# Patient Record
Sex: Female | Born: 1975 | Race: White | Hispanic: No | Marital: Married | Smoking: Former smoker
Health system: Southern US, Community
[De-identification: ages and names within clinical notes are randomized; demographics above are authoritative.]

## PROBLEM LIST (undated history)

## (undated) DIAGNOSIS — M1990 Unspecified osteoarthritis, unspecified site: Secondary | ICD-10-CM

## (undated) DIAGNOSIS — Z8719 Personal history of other diseases of the digestive system: Secondary | ICD-10-CM

## (undated) DIAGNOSIS — F419 Anxiety disorder, unspecified: Secondary | ICD-10-CM

## (undated) DIAGNOSIS — K219 Gastro-esophageal reflux disease without esophagitis: Secondary | ICD-10-CM

## (undated) DIAGNOSIS — J45909 Unspecified asthma, uncomplicated: Secondary | ICD-10-CM

## (undated) DIAGNOSIS — Z9889 Other specified postprocedural states: Secondary | ICD-10-CM

## (undated) DIAGNOSIS — I10 Essential (primary) hypertension: Secondary | ICD-10-CM

## (undated) DIAGNOSIS — R112 Nausea with vomiting, unspecified: Secondary | ICD-10-CM

## (undated) HISTORY — PX: LAPAROSCOPIC GASTRIC BANDING: SHX1100

## (undated) HISTORY — PX: KNEE ARTHROSCOPY: SUR90

## (undated) HISTORY — PX: APPENDECTOMY: SHX54

## (undated) HISTORY — PX: DILATION AND CURETTAGE OF UTERUS: SHX78

---

## 1999-05-14 ENCOUNTER — Encounter: Payer: Self-pay | Admitting: Family Medicine

## 1999-05-14 ENCOUNTER — Encounter: Admission: RE | Admit: 1999-05-14 | Discharge: 1999-05-14 | Payer: Self-pay | Admitting: Family Medicine

## 2016-07-10 HISTORY — PX: GASTRIC BANDING PORT REVISION: SHX5246

## 2016-07-14 ENCOUNTER — Other Ambulatory Visit (HOSPITAL_COMMUNITY): Payer: Self-pay | Admitting: General Surgery

## 2016-07-14 DIAGNOSIS — R110 Nausea: Secondary | ICD-10-CM

## 2016-07-17 ENCOUNTER — Ambulatory Visit (HOSPITAL_COMMUNITY)
Admission: RE | Admit: 2016-07-17 | Discharge: 2016-07-17 | Disposition: A | Discharge status: Home / Self Care | Payer: BC Managed Care – PPO | Source: Ambulatory Visit | Attending: General Surgery | Admitting: General Surgery

## 2016-07-17 ENCOUNTER — Encounter (HOSPITAL_COMMUNITY): Payer: Self-pay | Admitting: Radiology

## 2016-07-17 DIAGNOSIS — R110 Nausea: Secondary | ICD-10-CM | POA: Diagnosis present

## 2016-07-17 DIAGNOSIS — Z9884 Bariatric surgery status: Secondary | ICD-10-CM | POA: Insufficient documentation

## 2016-07-17 MED ORDER — MAGNESIUM HYDROXIDE 400 MG/5ML PO SUSP
ORAL | Status: AC
  Filled 2016-07-17: qty 30

## 2016-07-24 ENCOUNTER — Ambulatory Visit: Payer: Self-pay | Admitting: General Surgery

## 2016-07-24 NOTE — Progress Notes (Signed)
Spoke with angie at Dr. Madilyn FiremanHayes office request ing labs , lov and EKG tracing if available . Some show in Careeverywhere but won't print.

## 2016-07-24 NOTE — Patient Instructions (Signed)
Hulan SaasBobbi M Waiters  07/24/2016   Your procedure is scheduled on:   [facility]  [facility] 3rd floor to  Short Stay Center at    0715 AM.  Call this number if you have problems the morning of surgery [phone]   Remember: ONLY 1 PERSON MAY GO WITH YOU TO SHORT STAY TO GET  READY MORNING OF YOUR SURGERY.  Do not eat food or drink liquids :After Midnight.     Take these medicines the morning of surgery with A SIP OF WATER:  Zithromax, prozac, allegra and flonase if needed.                                You may not have any metal on your body including hair pins and              piercings  Do not wear jewelry, make-up, lotions, powders or perfumes, deodorant             Do not wear nail polish.  Do not shave  48 hours prior to surgery.               Do not bring valuables to the hospital. [facility] IS NOT             RESPONSIBLE   FOR VALUABLES.  Contacts, dentures or bridgework may not be worn into surgery.  Leave suitcase in the car. After surgery it may be brought to your room.                 Please read over the following fact sheets you were given: _____________________________________________________________________             [facility] - Preparing [facility] Before surgery, you can play an important role.  Because skin is not sterile, your skin needs to be as free of germs as possible.  You can reduce the number of germs on your skin by washing with CHG (chlorahexidine gluconate) soap before surgery.  CHG is an antiseptic cleaner which kills germs and bonds with the skin to continue killing germs even after washing. Please DO NOT use if you have an allergy to CHG or antibacterial soaps.  If your skin becomes reddened/irritated stop using the CHG and inform your nurse when you arrive at Short Stay. Do not shave (including legs and underarms) for at least 48 hours prior to the first CHG shower.  You may shave your  face/neck. Please follow these instructions carefully:  1.  Shower with CHG Soap the night before surgery and the  morning of Surgery.  2.  If you choose to wash your hair, wash your hair first as usual with your  normal  shampoo.  3.  After you shampoo, rinse your hair and body thoroughly to remove the  shampoo.                           4.  Use CHG as you would any other liquid soap.  You can apply chg directly  to the skin and wash                       Gently with a scrungie or clean washcloth.  5.  Apply the  CHG Soap to your body ONLY FROM THE NECK DOWN.   Do not use on face/ open                           Wound or open sores. Avoid contact with eyes, ears mouth and genitals (private parts).                       Wash face,  Genitals (private parts) with your normal soap.             6.  Wash thoroughly, paying special attention to the area where your surgery  will be performed.  7.  Thoroughly rinse your body with warm water from the neck down.  8.  DO NOT shower/wash with your normal soap after using and rinsing off  the CHG Soap.                9.  Pat yourself dry with a clean towel.            10.  Wear clean pajamas.            11.  Place clean sheets on your bed the night of your first shower and do not  sleep with pets. Day of Surgery : Do not apply any lotions/deodorants the morning of surgery.  Please wear clean clothes to the hospital/surgery center.  FAILURE TO FOLLOW THESE INSTRUCTIONS MAY RESULT IN THE CANCELLATION OF YOUR SURGERY PATIENT SIGNATURE_________________________________  NURSE SIGNATURE__________________________________  ________________________________________________________________________

## 2016-07-28 ENCOUNTER — Encounter (HOSPITAL_COMMUNITY): Payer: Self-pay | Admitting: Anesthesiology

## 2016-07-28 ENCOUNTER — Encounter (HOSPITAL_COMMUNITY): Payer: Self-pay

## 2016-07-28 ENCOUNTER — Encounter (HOSPITAL_COMMUNITY)
Admission: RE | Admit: 2016-07-28 | Discharge: 2016-07-28 | Disposition: A | Discharge status: Home / Self Care | Payer: BC Managed Care – PPO | Source: Ambulatory Visit | Attending: General Surgery | Admitting: General Surgery

## 2016-07-28 DIAGNOSIS — Z882 Allergy status to sulfonamides status: Secondary | ICD-10-CM | POA: Diagnosis not present

## 2016-07-28 DIAGNOSIS — R110 Nausea: Secondary | ICD-10-CM | POA: Diagnosis not present

## 2016-07-28 DIAGNOSIS — Z79899 Other long term (current) drug therapy: Secondary | ICD-10-CM | POA: Diagnosis not present

## 2016-07-28 DIAGNOSIS — Z87891 Personal history of nicotine dependence: Secondary | ICD-10-CM | POA: Diagnosis not present

## 2016-07-28 DIAGNOSIS — K649 Unspecified hemorrhoids: Secondary | ICD-10-CM | POA: Diagnosis not present

## 2016-07-28 DIAGNOSIS — K219 Gastro-esophageal reflux disease without esophagitis: Secondary | ICD-10-CM | POA: Diagnosis not present

## 2016-07-28 DIAGNOSIS — M1990 Unspecified osteoarthritis, unspecified site: Secondary | ICD-10-CM | POA: Diagnosis not present

## 2016-07-28 DIAGNOSIS — K9509 Other complications of gastric band procedure: Secondary | ICD-10-CM | POA: Diagnosis not present

## 2016-07-28 DIAGNOSIS — Z9884 Bariatric surgery status: Secondary | ICD-10-CM | POA: Diagnosis present

## 2016-07-28 DIAGNOSIS — R1012 Left upper quadrant pain: Secondary | ICD-10-CM | POA: Diagnosis not present

## 2016-07-28 DIAGNOSIS — Z8261 Family history of arthritis: Secondary | ICD-10-CM | POA: Diagnosis not present

## 2016-07-28 DIAGNOSIS — Y848 Other medical procedures as the cause of abnormal reaction of the patient, or of later complication, without mention of misadventure at the time of the procedure: Secondary | ICD-10-CM | POA: Diagnosis not present

## 2016-07-28 DIAGNOSIS — F419 Anxiety disorder, unspecified: Secondary | ICD-10-CM | POA: Diagnosis not present

## 2016-07-28 DIAGNOSIS — I10 Essential (primary) hypertension: Secondary | ICD-10-CM | POA: Diagnosis not present

## 2016-07-28 HISTORY — DX: Gastro-esophageal reflux disease without esophagitis: K21.9

## 2016-07-28 HISTORY — DX: Unspecified osteoarthritis, unspecified site: M19.90

## 2016-07-28 HISTORY — DX: Unspecified asthma, uncomplicated: J45.909

## 2016-07-28 HISTORY — DX: Personal history of other diseases of the digestive system: Z87.19

## 2016-07-28 HISTORY — DX: Anxiety disorder, unspecified: F41.9

## 2016-07-28 HISTORY — DX: Nausea with vomiting, unspecified: R11.2

## 2016-07-28 HISTORY — DX: Essential (primary) hypertension: I10

## 2016-07-28 HISTORY — DX: Other specified postprocedural states: Z98.890

## 2016-07-28 LAB — CBC WITH DIFFERENTIAL/PLATELET
Basophils Absolute: 0 10*3/uL (ref 0.0–0.1)
Basophils Relative: 0 %
EOS PCT: 2 %
Eosinophils Absolute: 0.1 10*3/uL (ref 0.0–0.7)
HCT: 38.5 % (ref 36.0–46.0)
Hemoglobin: 12.7 g/dL (ref 12.0–15.0)
LYMPHS ABS: 1.9 10*3/uL (ref 0.7–4.0)
LYMPHS PCT: 30 %
MCH: 28.9 pg (ref 26.0–34.0)
MCHC: 33 g/dL (ref 30.0–36.0)
MCV: 87.5 fL (ref 78.0–100.0)
MONO ABS: 0.6 10*3/uL (ref 0.1–1.0)
MONOS PCT: 9 %
Neutro Abs: 3.7 10*3/uL (ref 1.7–7.7)
Neutrophils Relative %: 59 %
Platelets: 296 10*3/uL (ref 150–400)
RBC: 4.4 MIL/uL (ref 3.87–5.11)
RDW: 13.6 % (ref 11.5–15.5)
WBC: 6.3 10*3/uL (ref 4.0–10.5)

## 2016-07-28 NOTE — Anesthesia Preprocedure Evaluation (Addendum)
Anesthesia Evaluation  Patient identified by MRN, date of birth, ID band Patient awake    Reviewed: Allergy & Precautions, NPO status , Patient's Chart, lab work & pertinent test results  History of Anesthesia Complications (+) PONV and history of anesthetic complications  Airway Mallampati: I  TM Distance: >3 FB Neck ROM: Full    Dental  (+) Teeth Intact, Dental Advisory Given   Pulmonary asthma , former smoker,    breath sounds clear to auscultation       Cardiovascular hypertension, Pt. on medications negative cardio ROS   Rhythm:Regular Rate:Normal     Neuro/Psych Anxiety negative neurological ROS  negative psych ROS   GI/Hepatic Neg liver ROS, hiatal hernia, GERD  ,  Endo/Other  negative endocrine ROS  Renal/GU negative Renal ROS  negative genitourinary   Musculoskeletal  (+) Arthritis , Osteoarthritis,    Abdominal   Peds negative pediatric ROS (+)  Hematology negative hematology ROS (+)   Anesthesia Other Findings Day of surgery medications reviewed with the patient.  Reproductive/Obstetrics negative OB ROS                            Anesthesia Physical Anesthesia Plan  ASA: III  Anesthesia Plan: General   Post-op Pain Management:    Induction: Intravenous  Airway Management Planned: Oral ETT  Additional Equipment:   Intra-op Plan:   Post-operative Plan: Extubation in OR  Informed Consent: I have reviewed the patients History and Physical, chart, labs and discussed the procedure including the risks, benefits and alternatives for the proposed anesthesia with the patient or authorized representative who has indicated his/her understanding and acceptance.   Dental advisory given  Plan Discussed with: CRNA  Anesthesia Plan Comments:         Anesthesia Quick Evaluation

## 2016-07-28 NOTE — Progress Notes (Signed)
CMP 07/17/16 [id number] 07/17/16 chart LOV Dr. Bing PlumeHaynes on chart

## 2016-07-29 ENCOUNTER — Ambulatory Visit (HOSPITAL_COMMUNITY): Payer: BC Managed Care – PPO | Admitting: Anesthesiology

## 2016-07-29 ENCOUNTER — Ambulatory Visit (HOSPITAL_COMMUNITY)
Admission: RE | Admit: 2016-07-29 | Discharge: 2016-07-29 | Disposition: A | Discharge status: Home / Self Care | Payer: BC Managed Care – PPO | Source: Ambulatory Visit | Attending: General Surgery | Admitting: General Surgery

## 2016-07-29 ENCOUNTER — Encounter (HOSPITAL_COMMUNITY)
Admission: RE | Disposition: A | Discharge status: Home / Self Care | Payer: Self-pay | Source: Ambulatory Visit | Attending: General Surgery

## 2016-07-29 ENCOUNTER — Encounter (HOSPITAL_COMMUNITY): Payer: Self-pay | Admitting: Anesthesiology

## 2016-07-29 DIAGNOSIS — Z79899 Other long term (current) drug therapy: Secondary | ICD-10-CM | POA: Insufficient documentation

## 2016-07-29 DIAGNOSIS — K219 Gastro-esophageal reflux disease without esophagitis: Secondary | ICD-10-CM | POA: Insufficient documentation

## 2016-07-29 DIAGNOSIS — R1012 Left upper quadrant pain: Secondary | ICD-10-CM | POA: Insufficient documentation

## 2016-07-29 DIAGNOSIS — I10 Essential (primary) hypertension: Secondary | ICD-10-CM | POA: Insufficient documentation

## 2016-07-29 DIAGNOSIS — F419 Anxiety disorder, unspecified: Secondary | ICD-10-CM | POA: Insufficient documentation

## 2016-07-29 DIAGNOSIS — K9509 Other complications of gastric band procedure: Secondary | ICD-10-CM | POA: Diagnosis not present

## 2016-07-29 DIAGNOSIS — K649 Unspecified hemorrhoids: Secondary | ICD-10-CM | POA: Insufficient documentation

## 2016-07-29 DIAGNOSIS — Y848 Other medical procedures as the cause of abnormal reaction of the patient, or of later complication, without mention of misadventure at the time of the procedure: Secondary | ICD-10-CM | POA: Insufficient documentation

## 2016-07-29 DIAGNOSIS — M1990 Unspecified osteoarthritis, unspecified site: Secondary | ICD-10-CM | POA: Insufficient documentation

## 2016-07-29 DIAGNOSIS — Z87891 Personal history of nicotine dependence: Secondary | ICD-10-CM | POA: Insufficient documentation

## 2016-07-29 DIAGNOSIS — Z8261 Family history of arthritis: Secondary | ICD-10-CM | POA: Insufficient documentation

## 2016-07-29 DIAGNOSIS — R110 Nausea: Secondary | ICD-10-CM | POA: Insufficient documentation

## 2016-07-29 DIAGNOSIS — Z882 Allergy status to sulfonamides status: Secondary | ICD-10-CM | POA: Insufficient documentation

## 2016-07-29 HISTORY — PX: LAPAROSCOPIC REPAIR AND REMOVAL OF GASTRIC BAND: SHX5919

## 2016-07-29 LAB — PREGNANCY, URINE: Preg Test, Ur: NEGATIVE

## 2016-07-29 SURGERY — LAPAROSCOPIC REPAIR AND REMOVAL OF GASTRIC BAND
Anesthesia: General | Site: Abdomen

## 2016-07-29 MED ORDER — LACTATED RINGERS IV SOLN: INTRAVENOUS | Status: DC

## 2016-07-29 MED ORDER — HEPARIN SODIUM (PORCINE) 5000 UNIT/ML IJ SOLN
5000.0000 [IU] | INTRAMUSCULAR | Status: AC
  Administered 2016-07-29: 5000 [IU] via SUBCUTANEOUS
  Filled 2016-07-29: qty 1

## 2016-07-29 MED ORDER — LACTATED RINGERS IR SOLN
Status: DC | PRN
  Administered 2016-07-29: 1000 mL

## 2016-07-29 MED ORDER — ACETAMINOPHEN 500 MG PO TABS
1000.0000 mg | ORAL_TABLET | ORAL | Status: AC
  Administered 2016-07-29: 1000 mg via ORAL
  Filled 2016-07-29: qty 2

## 2016-07-29 MED ORDER — SODIUM CHLORIDE 0.9% FLUSH: 3.0000 mL | INTRAVENOUS | Status: DC | PRN

## 2016-07-29 MED ORDER — BUPIVACAINE-EPINEPHRINE 0.25% -1:200000 IJ SOLN
INTRAMUSCULAR | Status: AC
  Filled 2016-07-29: qty 1

## 2016-07-29 MED ORDER — SODIUM CHLORIDE 0.9 % IJ SOLN
INTRAMUSCULAR | Status: AC
  Filled 2016-07-29: qty 50

## 2016-07-29 MED ORDER — ROCURONIUM BROMIDE 50 MG/5ML IV SOSY
PREFILLED_SYRINGE | INTRAVENOUS | Status: AC
Start: 2016-07-29 — End: 2016-07-29
  Filled 2016-07-29: qty 5

## 2016-07-29 MED ORDER — KETOROLAC TROMETHAMINE 30 MG/ML IJ SOLN
30.0000 mg | Freq: Four times a day (QID) | INTRAMUSCULAR | Status: DC
  Administered 2016-07-29: 30 mg via INTRAVENOUS

## 2016-07-29 MED ORDER — PROPOFOL 10 MG/ML IV BOLUS
INTRAVENOUS | Status: DC | PRN
  Administered 2016-07-29: 200 mg via INTRAVENOUS

## 2016-07-29 MED ORDER — PROPOFOL 10 MG/ML IV BOLUS
INTRAVENOUS | Status: AC
  Filled 2016-07-29: qty 40

## 2016-07-29 MED ORDER — CHLORHEXIDINE GLUCONATE CLOTH 2 % EX PADS: 6.0000 | MEDICATED_PAD | Freq: Once | CUTANEOUS | Status: DC

## 2016-07-29 MED ORDER — DEXAMETHASONE SODIUM PHOSPHATE 10 MG/ML IJ SOLN
INTRAMUSCULAR | Status: DC | PRN
  Administered 2016-07-29: 10 mg via INTRAVENOUS

## 2016-07-29 MED ORDER — CEFOTETAN DISODIUM-DEXTROSE 2-2.08 GM-% IV SOLR
2.0000 g | INTRAVENOUS | Status: AC
  Administered 2016-07-29: 2 g via INTRAVENOUS
  Filled 2016-07-29: qty 50

## 2016-07-29 MED ORDER — LIDOCAINE 2% (20 MG/ML) 5 ML SYRINGE
INTRAMUSCULAR | Status: DC | PRN
  Administered 2016-07-29: 80 mg via INTRAVENOUS

## 2016-07-29 MED ORDER — MIDAZOLAM HCL 5 MG/5ML IJ SOLN
INTRAMUSCULAR | Status: DC | PRN
  Administered 2016-07-29: 2 mg via INTRAVENOUS

## 2016-07-29 MED ORDER — ONDANSETRON HCL 4 MG/2ML IJ SOLN
INTRAMUSCULAR | Status: DC | PRN
  Administered 2016-07-29 (×2): 4 mg via INTRAVENOUS

## 2016-07-29 MED ORDER — SODIUM CHLORIDE 0.9 % IJ SOLN
INTRAMUSCULAR | Status: AC
  Filled 2016-07-29: qty 10

## 2016-07-29 MED ORDER — ACETAMINOPHEN 500 MG PO TABS: 1000.0000 mg | ORAL_TABLET | Freq: Four times a day (QID) | ORAL | Status: DC

## 2016-07-29 MED ORDER — SODIUM CHLORIDE 0.9 % IV SOLN
250.0000 mL | INTRAVENOUS | Status: DC | PRN
Start: 2016-07-29 — End: 2016-07-29

## 2016-07-29 MED ORDER — ACETAMINOPHEN 325 MG PO TABS: 650.0000 mg | ORAL_TABLET | ORAL | Status: DC | PRN

## 2016-07-29 MED ORDER — SODIUM CHLORIDE 0.9 % IV SOLN: INTRAVENOUS | Status: DC

## 2016-07-29 MED ORDER — SUFENTANIL CITRATE 50 MCG/ML IV SOLN
INTRAVENOUS | Status: DC | PRN
  Administered 2016-07-29: 5 ug via INTRAVENOUS
  Administered 2016-07-29: 10 ug via INTRAVENOUS
  Administered 2016-07-29 (×2): 5 ug via INTRAVENOUS

## 2016-07-29 MED ORDER — HYDROMORPHONE HCL 1 MG/ML IJ SOLN
0.2500 mg | INTRAMUSCULAR | Status: DC | PRN
  Administered 2016-07-29: 0.5 mg via INTRAVENOUS

## 2016-07-29 MED ORDER — LACTATED RINGERS IV SOLN
INTRAVENOUS | Status: DC | PRN
  Administered 2016-07-29 (×3): via INTRAVENOUS

## 2016-07-29 MED ORDER — SUFENTANIL CITRATE 50 MCG/ML IV SOLN
INTRAVENOUS | Status: AC
  Filled 2016-07-29: qty 1

## 2016-07-29 MED ORDER — BUPIVACAINE LIPOSOME 1.3 % IJ SUSP
20.0000 mL | Freq: Once | INTRAMUSCULAR | Status: AC
  Administered 2016-07-29: 20 mL
  Filled 2016-07-29: qty 20

## 2016-07-29 MED ORDER — GLYCOPYRROLATE 0.2 MG/ML IV SOSY
PREFILLED_SYRINGE | INTRAVENOUS | Status: DC | PRN
  Administered 2016-07-29: .2 mg via INTRAVENOUS

## 2016-07-29 MED ORDER — HYDROMORPHONE HCL 1 MG/ML IJ SOLN
INTRAMUSCULAR | Status: AC
  Filled 2016-07-29: qty 1

## 2016-07-29 MED ORDER — MIDAZOLAM HCL 2 MG/2ML IJ SOLN
INTRAMUSCULAR | Status: AC
  Filled 2016-07-29: qty 2

## 2016-07-29 MED ORDER — PROMETHAZINE HCL 25 MG/ML IJ SOLN: 6.2500 mg | INTRAMUSCULAR | Status: DC | PRN

## 2016-07-29 MED ORDER — EPHEDRINE 5 MG/ML INJ
INTRAVENOUS | Status: AC
  Filled 2016-07-29: qty 10

## 2016-07-29 MED ORDER — 0.9 % SODIUM CHLORIDE (POUR BTL) OPTIME
TOPICAL | Status: DC | PRN
  Administered 2016-07-29: 1000 mL

## 2016-07-29 MED ORDER — OXYCODONE HCL 5 MG PO TABS: 5.0000 mg | ORAL_TABLET | ORAL | Status: DC | PRN

## 2016-07-29 MED ORDER — MEPERIDINE HCL 50 MG/ML IJ SOLN: 6.2500 mg | INTRAMUSCULAR | Status: DC | PRN

## 2016-07-29 MED ORDER — ACETAMINOPHEN 650 MG RE SUPP
650.0000 mg | RECTAL | Status: DC | PRN
  Filled 2016-07-29: qty 1

## 2016-07-29 MED ORDER — LIDOCAINE 2% (20 MG/ML) 5 ML SYRINGE
INTRAMUSCULAR | Status: AC
  Filled 2016-07-29: qty 5

## 2016-07-29 MED ORDER — SCOPOLAMINE 1 MG/3DAYS TD PT72
1.0000 | MEDICATED_PATCH | TRANSDERMAL | Status: DC
  Administered 2016-07-29: 1.5 mg via TRANSDERMAL
  Filled 2016-07-29: qty 1

## 2016-07-29 MED ORDER — SODIUM CHLORIDE 0.9 % IJ SOLN
INTRAMUSCULAR | Status: DC | PRN
  Administered 2016-07-29: 50 mL

## 2016-07-29 MED ORDER — ONDANSETRON HCL 4 MG/2ML IJ SOLN
INTRAMUSCULAR | Status: AC
  Filled 2016-07-29: qty 2

## 2016-07-29 MED ORDER — ROCURONIUM BROMIDE 50 MG/5ML IV SOSY
PREFILLED_SYRINGE | INTRAVENOUS | Status: AC
  Filled 2016-07-29: qty 5

## 2016-07-29 MED ORDER — FENTANYL CITRATE (PF) 100 MCG/2ML IJ SOLN: 25.0000 ug | INTRAMUSCULAR | Status: DC | PRN

## 2016-07-29 MED ORDER — ROCURONIUM BROMIDE 10 MG/ML (PF) SYRINGE
PREFILLED_SYRINGE | INTRAVENOUS | Status: DC | PRN
  Administered 2016-07-29: 10 mg via INTRAVENOUS
  Administered 2016-07-29: 50 mg via INTRAVENOUS

## 2016-07-29 MED ORDER — SUGAMMADEX SODIUM 200 MG/2ML IV SOLN
INTRAVENOUS | Status: DC | PRN
  Administered 2016-07-29: 200 mg via INTRAVENOUS

## 2016-07-29 MED ORDER — DEXAMETHASONE SODIUM PHOSPHATE 4 MG/ML IJ SOLN
4.0000 mg | INTRAMUSCULAR | Status: DC
  Filled 2016-07-29: qty 1

## 2016-07-29 MED ORDER — EPHEDRINE SULFATE 50 MG/ML IJ SOLN
INTRAMUSCULAR | Status: DC | PRN
  Administered 2016-07-29: 10 mg via INTRAVENOUS

## 2016-07-29 MED ORDER — GABAPENTIN 300 MG PO CAPS
300.0000 mg | ORAL_CAPSULE | ORAL | Status: AC
  Administered 2016-07-29: 300 mg via ORAL
  Filled 2016-07-29: qty 1

## 2016-07-29 MED ORDER — SODIUM CHLORIDE 0.9% FLUSH: 3.0000 mL | Freq: Two times a day (BID) | INTRAVENOUS | Status: DC

## 2016-07-29 MED ORDER — DEXAMETHASONE SODIUM PHOSPHATE 10 MG/ML IJ SOLN
INTRAMUSCULAR | Status: AC
  Filled 2016-07-29: qty 1

## 2016-07-29 MED ORDER — OXYCODONE HCL 5 MG PO TABS: 5.0000 mg | ORAL_TABLET | ORAL | 0 refills | Status: DC | PRN

## 2016-07-29 SURGICAL SUPPLY — 49 items
BANDAGE ADH SHEER 1  50/CT (GAUZE/BANDAGES/DRESSINGS) ×2 IMPLANT
BENZOIN TINCTURE PRP APPL 2/3 (GAUZE/BANDAGES/DRESSINGS) ×2 IMPLANT
BLADE HEX COATED 2.75 (ELECTRODE) ×2 IMPLANT
BLADE SURG 15 STRL LF DISP TIS (BLADE) ×1 IMPLANT
BLADE SURG 15 STRL SS (BLADE) ×1
BLADE SURG SZ11 CARB STEEL (BLADE) ×2 IMPLANT
CHLORAPREP W/TINT 26ML (MISCELLANEOUS) ×2 IMPLANT
COVER SURGICAL LIGHT HANDLE (MISCELLANEOUS) ×2 IMPLANT
DECANTER SPIKE VIAL GLASS SM (MISCELLANEOUS) ×2 IMPLANT
DERMABOND ADVANCED (GAUZE/BANDAGES/DRESSINGS)
DERMABOND ADVANCED .7 DNX12 (GAUZE/BANDAGES/DRESSINGS) IMPLANT
DEVICE SUTURE ENDOST 10MM (ENDOMECHANICALS) IMPLANT
DISSECTOR BLUNT TIP ENDO 5MM (MISCELLANEOUS) IMPLANT
DRAPE UTILITY XL STRL (DRAPES) ×4 IMPLANT
ELECT PENCIL ROCKER SW 15FT (MISCELLANEOUS) ×2 IMPLANT
ELECT REM PT RETURN 9FT ADLT (ELECTROSURGICAL) ×2
ELECTRODE REM PT RTRN 9FT ADLT (ELECTROSURGICAL) ×1 IMPLANT
GLOVE BIO SURGEON STRL SZ7.5 (GLOVE) ×2 IMPLANT
GLOVE BIOGEL M STRL SZ7.5 (GLOVE) IMPLANT
GLOVE INDICATOR 8.0 STRL GRN (GLOVE) ×2 IMPLANT
GOWN STRL REUS W/TWL LRG LVL3 (GOWN DISPOSABLE) ×4 IMPLANT
GOWN STRL REUS W/TWL XL LVL3 (GOWN DISPOSABLE) ×4 IMPLANT
GRASPER SUT TROCAR 14GX15 (MISCELLANEOUS) ×2 IMPLANT
HOVERMATT SINGLE USE (MISCELLANEOUS) IMPLANT
IRRIG SUCT STRYKERFLOW 2 WTIP (MISCELLANEOUS)
IRRIGATION SUCT STRKRFLW 2 WTP (MISCELLANEOUS) IMPLANT
KIT BASIN OR (CUSTOM PROCEDURE TRAY) ×2 IMPLANT
L-HOOK LAP DISP 36CM (ELECTROSURGICAL) ×2
LHOOK LAP DISP 36CM (ELECTROSURGICAL) ×1 IMPLANT
NEEDLE SPNL 22GX3.5 QUINCKE BK (NEEDLE) ×2 IMPLANT
NS IRRIG 1000ML POUR BTL (IV SOLUTION) ×2 IMPLANT
PACK UNIVERSAL I (CUSTOM PROCEDURE TRAY) ×2 IMPLANT
SET IRRIG TUBING LAPAROSCOPIC (IRRIGATION / IRRIGATOR) ×2 IMPLANT
SHEARS HARMONIC ACE PLUS 36CM (ENDOMECHANICALS) IMPLANT
SOLUTION ANTI FOG 6CC (MISCELLANEOUS) ×2 IMPLANT
SPONGE LAP 18X18 X RAY DECT (DISPOSABLE) ×2 IMPLANT
STAPLER VISISTAT 35W (STAPLE) IMPLANT
STRIP CLOSURE SKIN 1/2X4 (GAUZE/BANDAGES/DRESSINGS) ×2 IMPLANT
SUT MNCRL AB 4-0 PS2 18 (SUTURE) ×2 IMPLANT
SUT SILK 0 (SUTURE) ×1
SUT SILK 0 30XBRD TIE 6 (SUTURE) ×1 IMPLANT
SUT VIC AB 0 BRD 54 (SUTURE) ×2 IMPLANT
SUT VIC AB 2-0 SH 27 (SUTURE) ×1
SUT VIC AB 2-0 SH 27X BRD (SUTURE) ×1 IMPLANT
SYR 20CC LL (SYRINGE) ×2 IMPLANT
SYR CONTROL 10ML LL (SYRINGE) ×2 IMPLANT
TOWEL OR 17X26 10 PK STRL BLUE (TOWEL DISPOSABLE) ×2 IMPLANT
TROCAR BLADELESS OPT 5 100 (ENDOMECHANICALS) ×6 IMPLANT
TUBING INSUF HEATED (TUBING) ×2 IMPLANT

## 2016-07-29 NOTE — Transfer of Care (Signed)
Immediate Anesthesia Transfer of Care Note  Patient: [name]  Procedure(s) Performed: Procedure(s): LAPAROSCOPIC REPAIR AND REMOVAL OF GASTRIC BAND (N/A)  Patient Location: PACU  Anesthesia Type:General  Level of Consciousness:  sedated, patient cooperative and responds to stimulation  Airway & Oxygen Therapy:Patient Spontanous Breathing and Patient connected to face mask oxgen  Post-op Assessment:  Report given to PACU RN and Post -op Vital signs reviewed and stable  Post vital signs:  Reviewed and stable  Last Vitals:  Vitals:   07/29/16 0740  BP: 119/86  Pulse: (!) 57  Resp: 18  Temp: 36.4 C    Complications: No apparent anesthesia complications

## 2016-07-29 NOTE — Op Note (Signed)
07/29/2016  11:10 AM  PATIENT:  [name]  41 y.o. female  PRE-OPERATIVE DIAGNOSIS:  H/O Lap Band, LUQ pain, nausea  POST-OPERATIVE DIAGNOSIS:  same  PROCEDURE:  Procedure(s): LAPAROSCOPIC REMOVAL OF GASTRIC BAND  SURGEON:  Surgeon(s): Gaynelle Adu, MD Ovidio Kin, MD  ASSISTANTS: Ovidio Kin, MD; Shelbie Proctor PA-S   ANESTHESIA:   general  DRAINS: none   LOCAL MEDICATIONS USED:  OTHER Exparel  SPECIMEN:  Source of Specimen:  lap band in pieces  DISPOSITION OF SPECIMEN:  discarded  COUNTS:  YES  INDICATION FOR PROCEDURE: 41 year old Caucasian female underwent laparoscopic adjustable gastric band placement in 2011 presented with ongoing left upper quadrant pain and nausea. She had the fluid removed from the band but still had intermittent left upper quadrant discomfort and nausea. She requested removal of her lap band. A preoperative upper GI showed no obstruction or slip or hiatal hernia. We had a prolonged conversation regarding risk and benefits of removal of the LapBand. We also talked about the possibility that removal of her lap band may not ameliorate her pain. Nonetheless she requested it be removed.  PROCEDURE: Patient received oral Tylenol and gabapentin preoperatively along with subcutaneous heparin. After obtaining informed consent she was taken to operating room 2 at [facility]. General endotracheal anesthesia was established. A Foley catheter and sequential compression devices were placed. Her abdomen was prepped and draped in the usual standard surgical fashion. A surgical timeout was performed. She received IV antibiotics prior to skin incision. Access was obtained to her abdomen using the Optiview technique. I used a small left upper quadrant old scar. Using a 0 5 mm laparoscope I advanced the trocar under direct visualization into the abdominal cavity. Pneumoperitoneum was smoothly established up to a patient pressure 15 mmHg. Laparoscope was advanced  and the abdominal cavity was surveilled. There is no evidence of injury surrounding structures. A 5 mm trocar was placed slightly above and to the left of the umbilicus. Another 5 mmin the right upper quadrant through an old scar. And a 15 mm trocar was placed through the old port site incision in the right mid abdomen. All of this is done under direct visualization. The [facility] liver retractor was placed in the epigastric location underneath the left lobe the liver through an old incision. There were a few adhesions underneath the left lobe the liver to the LapBand. But there wasn't too much adhesions around the lap band itself. It appeared that the imbricating sutures around the LapBand had fallen away. The LapBand was still buckled but there is no evidence of erosion. It was difficult to tell whether or not there is an actual slip however the LapBand was still oriented in the correct position; however, there were no imbricating sutures from the gastric fundus to the top of the pouch for example. A few adhesions were taken down around the proximal tubing with electrocautery. The LapBand was unbuckled. It was then cut. The portion of the LapBand around the stomach was removed from the 15 mm trocar. Upon closer visualization of her diaphragm there appear to be no gross evidence of a hiatal hernia. There is a small cicatrix around the proximal stomach where the LapBand had been placed. It was partially loosen but the entire cicatrix was not removed. A calibration tube was advanced through the oropharynx down into the stomach without any resistance it passed with ease. Because this to passed with ease with no difficulty I did not feel it was worth the  risk of removing the cicatrix. The [facility] liver retractor was removed. I then removed the 15 mm trocar in pneumoperitoneum was released. I opened up the 15 mm trocar incision slightly and using electrocautery dissected down toward the port. It had 3 sutures  anchoring it in place which were removed. The port and remaining tubing were then easily pulled out. I then reestablished pneumoperitoneum. The 15 mm fascial defect was closed with an interrupted 0 Vicryl using the PMI suture passer. Exparel was then infiltrated in bilateral upper abdominal walls under direct visualization as a tap block. Pneumoperitoneum was released. The 15 mm trocar site was closed in 2 layers with a 2-0 Vicryl in the deep dermis and a 4-0 Monocryl in a subcuticular fashion. Remaining skin incisions were closed with a 4-0 Monocryl. Benzoin, Steri-Strips, and sterile bandages were applied. All needle, instrument, and sponge counts were correct 2. There are no immediate complications. The patient tolerated procedure well  PLAN OF CARE: Discharge to home after PACU  PATIENT DISPOSITION:  PACU - hemodynamically stable.   Delay start of Pharmacological VTE agent (>24hrs) due to surgical blood loss or risk of bleeding:  not applicable  Mary SellaEric M. Andrey CampanileWilson, MD, FACS General, Bariatric, & Minimally Invasive Surgery [facility], [place]

## 2016-07-29 NOTE — Anesthesia Procedure Notes (Signed)
Procedure Name: Intubation Date/Time: 07/29/2016 9:13 AM Performed by: Milina Pagett, Virgel Gess Pre-anesthesia Checklist: Patient identified, Emergency Drugs available, Suction available, Patient being monitored and Timeout performed Patient Re-evaluated:Patient Re-evaluated prior to inductionOxygen Delivery Method: Circle system utilized Preoxygenation: Pre-oxygenation with 100% oxygen Intubation Type: IV induction Ventilation: Mask ventilation without difficulty Laryngoscope Size: Mac and 4 Grade View: Grade I Tube type: Oral Tube size: 7.5 mm Number of attempts: 1 Airway Equipment and Method: Stylet Placement Confirmation: ETT inserted through vocal cords under direct vision,  positive ETCO2,  CO2 detector and breath sounds checked- equal and bilateral Secured at: 21 cm Tube secured with: Tape Dental Injury: Teeth and Oropharynx as per pre-operative assessment

## 2016-07-29 NOTE — Anesthesia Postprocedure Evaluation (Addendum)
Anesthesia Post Note  Patient: [name]  Procedure(s) Performed: Procedure(s) (LRB): LAPAROSCOPIC REPAIR AND REMOVAL OF GASTRIC BAND (N/A)  Patient location during evaluation: PACU Anesthesia Type: General Level of consciousness: awake and alert Pain management: pain level controlled Vital Signs Assessment: post-procedure vital signs reviewed and stable Respiratory status: spontaneous breathing, nonlabored ventilation, respiratory function stable and patient connected to nasal cannula oxygen Cardiovascular status: blood pressure returned to baseline and stable Postop Assessment: no signs of nausea or vomiting Anesthetic complications: no       Last Vitals:  Vitals:   07/29/16 1151 07/29/16 1233  BP: 130/74 128/85  Pulse: 74 67  Resp: 16 16  Temp: 36.3 C 36.6 C    Last Pain:  Vitals:   07/29/16 1233  TempSrc: Oral  PainSc: 1                  Shelton SilvasKevin D Jackey Housey

## 2016-07-29 NOTE — H&P (Signed)
[name] 07/10/2016 9:58 AM Location: [facility] Patient #: [id number] DOB: Apr 20, 1976 Married / Language: English / Race: White Female   History of Present Illness Veronica Areola M. [name] MD; 07/10/2016 1:22 PM) The patient is a 41 year old female is here for LapBand followup. She is referred by Dr. [name] for evaluation of removal of laparoscopic adjustable gastric band. She underwent placement of a laparoscopic adjustable gastric band (AP standard) with hiatal hernia repair December 2011 at [facility] by Dr. [name]. She states that she has had intermittent issues with her band. Her preoperative weight was around 218 pounds. She states that she hasn't had much success with it. Most recently she was having obstructive symptoms and went to see them in early February and had all the fluid removed from her band. She has 4.8 cc removed. She was having a fair amount of nausea, bloating, as well as intolerance to solids and liquids. She is frustrated with her lap band experience and just wants the band removed. She is concerned about long-term implications for reflux and esophageal dilatation. Since she has had the fluid removed she no longer has dysphagia.. She is able to tolerate solids and liquids. She has less nausea but it is still there occasionally. She still has an occasional sensation of bloating. She is able to eat larger portions now. She denies any hematemesis, nighttime cough, reflux.  She denies any chest pain, chest pressure, shortness of breath, orthopnea, paroxysmal nocturnal dyspnea   Problem List/Past Medical Veronica Areola M. Andrey Campanile, MD; 07/10/2016 1:25 PM) NAUSEA (R11.0)   Past Surgical History Veronica Areola M. Andrey Campanile, MD; 07/10/2016 1:25 PM) Appendectomy  Knee Surgery  Left. Lap Band   Diagnostic Studies History Veronica Areola M. Andrey Campanile, MD; 07/10/2016 1:25 PM) Colonoscopy  never Mammogram  within last year Pap Smear  1-5 years ago  Allergies [name], CMA;  07/10/2016 9:59 AM) Sulfa Antibiotics  Rash Allergies Reconciled   Medication History [name], CMA; 07/10/2016 10:00 AM) Lisinopril (20MG  Tablet, Oral daily) Active. Azithromycin (250MG  Tablet, Oral every other day) Active. FLUoxetine HCl (20MG  Capsule, Oral daily) Active. Medications Reconciled  Social History Veronica Areola M. Andrey Campanile, MD; 07/10/2016 1:25 PM) Alcohol use  Moderate alcohol use. Caffeine use  Tea. No drug use  Tobacco use  Former smoker.  Family History Veronica Areola M. Andrey Campanile, MD; 07/10/2016 1:25 PM) Arthritis  Mother. Family history unknown  First Degree Relatives   Pregnancy / Birth History Veronica Sella. Andrey Campanile, MD; 07/10/2016 1:25 PM) Age at menarche  10 years. Contraceptive History  Oral contraceptives. Gravida  3 Length (months) of breastfeeding  7-12 Maternal age  52-20 Para  1 Regular periods   Other Problems Veronica Areola M. Andrey Campanile, MD; 07/10/2016 1:25 PM) Anxiety Disorder  Arthritis  LAP-BAND SURGERY STATUS (Z98.84)  AP standard band, with hiatal hernia repair, April 29, 2010, [facility], Dr. [name] Asthma  Back Pain  Gastroesophageal Reflux Disease  Hemorrhoids  High blood pressure   Vitals (Sade Bradford CMA; 07/10/2016 10:01 AM) 07/10/2016 10:00 AM Weight: 207 lb Height: 60in Body Surface Area: 1.89 m Body Mass Index: 40.43 kg/m  Temp.: 98.12F  Pulse: 61 (Regular)  Resp.: 98 (Unlabored)  BP: 112/72 (Sitting, Left Arm, Standard)       Physical Exam Veronica Areola M. Miesha Bachmann MD; 07/10/2016 1:19 PM) General Mental Status-Alert. General Appearance-Consistent with stated age. Hydration-Well hydrated. Voice-Normal.  Head and Neck Head-normocephalic, atraumatic with no lesions or palpable masses. Trachea-midline. Thyroid Gland Characteristics - normal size and consistency.  Eye Eyeball -  Bilateral-Normal. Sclera/Conjunctiva - Bilateral-No scleral icterus.  Chest and Lung Exam Chest and lung exam  reveals -quiet, even and easy respiratory effort with no use of accessory muscles and on auscultation, normal breath sounds, no adventitious sounds and normal vocal resonance. Inspection Chest Wall - Normal. Back - normal.  Breast - Did not examine.  Cardiovascular Cardiovascular examination reveals -normal heart sounds, regular rate and rhythm with no murmurs and normal pedal pulses bilaterally.  Abdomen Inspection Inspection of the abdomen reveals - No Hernias. Skin - Scar - Note: well healed trocar scars; palpable port right abd. Palpation/Percussion Palpation and Percussion of the abdomen reveal - Soft, Non Tender, No Rebound tenderness, No Rigidity (guarding) and No hepatosplenomegaly. Auscultation Auscultation of the abdomen reveals - Bowel sounds normal.  Peripheral Vascular Upper Extremity Palpation - Pulses bilaterally normal.  Neurologic Neurologic evaluation reveals -alert and oriented x 3 with no impairment of recent or remote memory. Mental Status-Normal.  Neuropsychiatric The patient's mood and affect are described as -normal. Judgment and Insight-insight is appropriate concerning matters relevant to self.  Musculoskeletal Normal Exam - Left-Upper Extremity Strength Normal and Lower Extremity Strength Normal. Normal Exam - Right-Upper Extremity Strength Normal and Lower Extremity Strength Normal.  Lymphatic Head & Neck  General Head & Neck Lymphatics: Bilateral - Description - Normal. Axillary - Did not examine. Femoral & Inguinal - Did not examine.    Assessment & Plan Veronica Areola(Daran Favaro M. Avrum Kimball MD; 07/10/2016 1:25 PM) LAP-BAND SURGERY STATUS [phone](Z98.84) Story: AP standard band, with hiatal hernia repair, [facility] Dr. Barnetta ChapelMcNatt Impression: Her symptoms of intolerance to solids and liquids have resolved thankfully with removal of her fluids. Nonetheless she still desires removal of her adjustable gastric band. She had an upper  GI in 2015 that had a question of a small hiatal hernia. I do not have access to that imaging. She inquires whether or not if she has a hiatal hernia if it could be repaired at the time of her band removal. We discussed at length the risk and benefits of surgery including but not limited to bleeding, infection, injury to surrounding structures, injury to the stomach, esophagus her liver, blood clot formation, hernia formation, cardiac and pulmonary events. We discussed the typical recovery course. I would like to get a repeat upper GI between now and surgery to evaluate her foregut anatomy. We did discuss potential hiatal hernia repair. I discussed how this could be somewhat challenging due to scarring from her prior surgery. I would only recommend attempts at repair if there is a truly significant recurrence. She voiced understanding. All of her questions were asked and answered. Current Plans You are being scheduled for surgery- Our schedulers will call you.  You should hear from our office's scheduling department within 5 working days about the location, date, and time of surgery. We try to make accommodations for patient's preferences in scheduling surgery, but sometimes the OR schedule or the surgeon's schedule prevents us from making those accommodations.  If you have not heard from our office [phone]) in 5 working days, call the office and ask for your surgeon's nurse.  If you have other questions about your diagnosis, plan, or surgery, call the office and ask for your surgeon's nurse.  Pt Education - [facility] Free Text Education/Instructions: discussed with patient and provided information. NAUSEA (R11.0)  Veronica SellaEric M. Andrey CampanileWilson, MD, FACS General, Bariatric, & Minimally Invasive Surgery [facility], [place]

## 2016-07-29 NOTE — Discharge Instructions (Signed)
General Anesthesia, Adult, Care After These instructions provide you with information about caring for yourself after your procedure. Your health care provider may also give you more specific instructions. Your treatment has been planned according to current medical practices, but problems sometimes occur. Call your health care provider if you have any problems or questions after your procedure. What can I expect after the procedure? After the procedure, it is common to have: Vomiting. A sore throat. Mental slowness. It is common to feel: Nauseous. Cold or shivery. Sleepy. Tired. Sore or achy, even in parts of your body where you did not have surgery. Follow these instructions at home: For at least 24 hours after the procedure:  Do not: Participate in activities where you could fall or become injured. Drive. Use heavy machinery. Drink alcohol. Take sleeping pills or medicines that cause drowsiness. Make important decisions or sign legal documents. Take care of children on your own. Rest. Eating and drinking  If you vomit, drink water, juice, or soup when you can drink without vomiting. Drink enough fluid to keep your urine clear or pale yellow. Make sure you have little or no nausea before eating solid foods. Follow the diet recommended by your health care provider. General instructions  Have a responsible adult stay with you until you are awake and alert. Return to your normal activities as told by your health care provider. Ask your health care provider what activities are safe for you. Take over-the-counter and prescription medicines only as told by your health care provider. If you smoke, do not smoke without supervision. Keep all follow-up visits as told by your health care provider. This is important. Contact a health care provider if: You continue to have nausea or vomiting at home, and medicines are not helpful. You cannot drink fluids or start eating again. You cannot  urinate after 8-12 hours. You develop a skin rash. You have fever. You have increasing redness at the site of your procedure. Get help right away if: You have difficulty breathing. You have chest pain. You have unexpected bleeding. You feel that you are having a life-threatening or urgent problem. This information is not intended to replace advice given to you by your health care provider. Make sure you discuss any questions you have with your health care provider. Document Released: 08/04/2000 Document Revised: 10/01/2015 Document Reviewed: 04/12/2015 Elsevier Interactive Patient Education  2017 [employer]. [facility]    LAPAROSCOPIC SURGERY: POST OP INSTRUCTIONS Always review your discharge instruction sheet given to you by the facility where your surgery was performed. IF YOU HAVE DISABILITY OR FAMILY LEAVE FORMS, YOU MUST BRING THEM TO THE OFFICE FOR PROCESSING.   DO NOT GIVE THEM TO YOUR DOCTOR.  1. A prescription for pain medication may be given to you upon discharge.  Take your pain medication as prescribed, if needed.  If narcotic pain medicine is not needed, then you may take acetaminophen (Tylenol) or ibuprofen (Advil) as needed. 2. Take your usually prescribed medications unless otherwise directed. 3. If you need a refill on your pain medication, please contact your pharmacy.  They will contact our office to request authorization. Prescriptions will not be filled after 5pm or on week-ends. 4. You should stay on a liquid (clear/full liquid) diet the first few days after arrival home.  Be sure to include lots of fluids daily. 5. Most patients will experience some swelling and bruising in the area of the incisions.  Ice packs will help.  Swelling and  bruising can take several days to resolve.  6. It is common to experience some constipation if taking pain medication after surgery.  Increasing fluid intake and taking a stool softener (such as Colace) will  usually help or prevent this problem from occurring.  A mild laxative (Milk of Magnesia or Miralax) should be taken according to package instructions if there are no bowel movements after 48 hours. 7. Unless discharge instructions indicate otherwise, you may remove your bandages 48 hours after surgery, and you may shower at that time.  You  have steri-strips (small skin tapes) in place directly over the incision.  These strips should be left on the skin for 7-10 days.  8. ACTIVITIES:  You may resume regular (light) daily activities beginning the next day--such as daily self-care, walking, climbing stairs--gradually increasing activities as tolerated.  You may have sexual intercourse when it is comfortable.  Refrain from any heavy lifting or straining until approved by your doctor. a. You may drive when you are no longer taking prescription pain medication, you can comfortably wear a seatbelt, and you can safely maneuver your car and apply brakes. 9. You should see your doctor in the office for a follow-up appointment approximately 2-3 weeks after your surgery.  Make sure that you call for this appointment within a day or two after you arrive home to insure a convenient appointment time. 10. OTHER INSTRUCTIONS: do not lift, push, or pull anything greater than 15 minutes  WHEN TO CALL YOUR DOCTOR: 1. Fever over 101.0 2. Inability to urinate 3. Continued bleeding from incision. 4. Increased pain, redness, or drainage from the incision. 5. Increasing abdominal pain  The clinic staff is available to answer your questions during regular business hours.  Please dont hesitate to call and ask to speak to one of the nurses for clinical concerns.  If you have a medical emergency, go to the nearest emergency room or call 911.  [facility] is always on call at the hospital. [address], [address], [place], [place]  [address] ? [address] [address], [place], [place]   [address] [phone]  [phone] ? [phone] ? FAX [phone] Web site: [email or web address]

## 2016-07-29 NOTE — Interval H&P Note (Signed)
History and Physical Interval Note:  07/29/2016 8:46 AM  Veronica [name]  has presented today for surgery, with the diagnosis of H/O Lap Band, N/V, HTN, GERD  The various methods of treatment have been discussed with the patient and family. After consideration of risks, benefits and other options for treatment, the patient has consented to  Procedure(s): LAPAROSCOPIC REPAIR AND REMOVAL OF GASTRIC BAND POSSIBLE HIATAL HERNIA REPAIR UPPER ENDOSCOPY (N/A) as a surgical intervention .  The patient's history has been reviewed, patient examined, no change in status, stable for surgery.  I have reviewed the patient's chart and labs.  Questions were answered to the patient's satisfaction.    Her UGI showed no obstruction; no hiatal hernia. As we discussed previously I could not 100% guarantee that removal of her lap band would ameliorate her LUQ pain/nausea/bloating sensation. We also rediscussed that I did not appreciate a hiatal hernia on UGI. We would test for one intraop and only proceed with repair if we found a clinically significant one but if found a small one I would not recommend attempts at repair bc I believe risks outweigh benefits. She voiced understanding and agreed with the plan  Veronica SellaEric Nunez. Veronica CampanileWilson, MD, FACS General, Bariatric, & Minimally Invasive Surgery [facility] [place]   [facility]IL[facility],[name]

## 2016-07-30 ENCOUNTER — Encounter (HOSPITAL_COMMUNITY): Payer: Self-pay | Admitting: General Surgery

## 2016-10-10 NOTE — Addendum Note (Signed)
Addendum  created 10/10/16 1112 by Yannet Rincon D, MD   Sign clinical note    

## 2017-06-10 ENCOUNTER — Ambulatory Visit: Payer: Self-pay | Admitting: General Surgery

## 2017-06-11 NOTE — Patient Instructions (Addendum)
[name]  06/11/2017   Your procedure is scheduled on: 06/15/2017   Report to [facility] Main  Entrance Report to Admitting at  07:25 AM   Call this number if you have problems the morning of surgery [phone]   Remember: No eating or drinking after Midnight   Take these medicines the morning of surgery with A SIP OF WATER: Prozac, and Protonix. You may also bring and use your nasal spray as needed.                                 You may not have any metal on your body including hair pins and              piercings  Do not wear jewelry, make-up, lotions, powders or perfumes, deodorant             Do not wear nail polish.  Do not shave  48 hours prior to surgery.            Do not bring valuables to the hospital. [facility] IS NOT             RESPONSIBLE   FOR VALUABLES.  Contacts, dentures or bridgework may not be worn into surgery.      Patients discharged the day of surgery will not be allowed to drive home.  Name and phone number of your driver: Gala Romney [phone]                Please read over the following fact sheets you were given: _____________________________________________________________________             NO SOLID FOOD AFTER MIDNIGHT THE NIGHT PRIOR TO SURGERY. NOTHING BY MOUTH EXCEPT CLEAR LIQUIDS UNTIL 3 HOURS PRIOR TO SCHEDULED SURGERY. PLEASE FINISH ENSURE DRINK PER SURGEON ORDER 3 HOURS PRIOR TO SCHEDULED SURGERY TIME WHICH NEEDS TO BE COMPLETED AT ___0625    CLEAR LIQUID DIET   Foods Allowed                                                                     Foods Excluded  Coffee and tea, regular and decaf                             liquids that you cannot  Plain Jell-O in any flavor                                             see through such as: Fruit ices (not with fruit pulp)                                     milk, soups, orange juice  Iced Popsicles  All solid food Carbonated  beverages, regular and diet                                    Cranberry, grape and apple juices Sports drinks like Gatorade Lightly seasoned clear broth or consume(fat free) Sugar, honey syrup  Sample Menu Breakfast                                Lunch                                     Supper Cranberry juice                    Beef broth                            Chicken broth Jell-O                                     Grape juice                           Apple juice Coffee or tea                        Jell-O                                      Popsicle                                                Coffee or tea                        Coffee or tea  _____________________________________________________________________      [facility] Pre[facility] Before surgery, you can play an important role.  Because skin is not sterile, your skin needs to be as free of germs as possible.  You can reduce the number of germs on your skin by washing with CHG (chlorahexidine gluconate) soap before surgery.  CHG is an antiseptic cleaner which kills germs and bonds with the skin to continue killing germs even after washing. Please DO NOT use if you have an allergy to CHG or antibacterial soaps.  If your skin becomes reddened/irritated stop using the CHG and inform your nurse when you arrive at Short Stay. Do not shave (including legs and underarms) for at least 48 hours prior to the first CHG shower.  You may shave your face/neck. Please follow these instructions carefully:  1.  Shower with CHG Soap the night before surgery and the  morning of Surgery.  2.  If you choose to wash your hair, wash your hair first as usual with your  normal  shampoo.  3.  After you shampoo, rinse your hair and body thoroughly to remove the  shampoo.  4.  Use CHG as you would any other liquid soap.  You can apply chg directly  to the skin and wash                       Gently with a  scrungie or clean washcloth.  5.  Apply the CHG Soap to your body ONLY FROM THE NECK DOWN.   Do not use on face/ open                           Wound or open sores. Avoid contact with eyes, ears mouth and genitals (private parts).                       Wash face,  Genitals (private parts) with your normal soap.             6.  Wash thoroughly, paying special attention to the area where your surgery  will be performed.  7.  Thoroughly rinse your body with warm water from the neck down.  8.  DO NOT shower/wash with your normal soap after using and rinsing off  the CHG Soap.                9.  Pat yourself dry with a clean towel.            10.  Wear clean pajamas.            11.  Place clean sheets on your bed the night of your first shower and do not  sleep with pets. Day of Surgery : Do not apply any lotions/deodorants the morning of surgery.  Please wear clean clothes to the hospital/surgery center.  FAILURE TO FOLLOW THESE INSTRUCTIONS MAY RESULT IN THE CANCELLATION OF YOUR SURGERY PATIENT SIGNATURE_________________________________  NURSE SIGNATURE__________________________________  ________________________________________________________________________

## 2017-06-12 ENCOUNTER — Encounter (HOSPITAL_COMMUNITY): Payer: Self-pay

## 2017-06-12 ENCOUNTER — Encounter (HOSPITAL_COMMUNITY)
Admission: RE | Admit: 2017-06-12 | Discharge: 2017-06-12 | Disposition: A | Discharge status: Home / Self Care | Payer: BC Managed Care – PPO | Source: Ambulatory Visit | Attending: General Surgery | Admitting: General Surgery

## 2017-06-12 ENCOUNTER — Other Ambulatory Visit: Payer: Self-pay

## 2017-06-12 DIAGNOSIS — K219 Gastro-esophageal reflux disease without esophagitis: Secondary | ICD-10-CM | POA: Diagnosis not present

## 2017-06-12 DIAGNOSIS — K8010 Calculus of gallbladder with chronic cholecystitis without obstruction: Secondary | ICD-10-CM | POA: Diagnosis present

## 2017-06-12 DIAGNOSIS — J45909 Unspecified asthma, uncomplicated: Secondary | ICD-10-CM | POA: Diagnosis not present

## 2017-06-12 DIAGNOSIS — K649 Unspecified hemorrhoids: Secondary | ICD-10-CM | POA: Diagnosis not present

## 2017-06-12 DIAGNOSIS — K828 Other specified diseases of gallbladder: Secondary | ICD-10-CM | POA: Diagnosis not present

## 2017-06-12 DIAGNOSIS — K811 Chronic cholecystitis: Secondary | ICD-10-CM | POA: Diagnosis not present

## 2017-06-12 DIAGNOSIS — Z79899 Other long term (current) drug therapy: Secondary | ICD-10-CM | POA: Diagnosis not present

## 2017-06-12 DIAGNOSIS — Z87891 Personal history of nicotine dependence: Secondary | ICD-10-CM | POA: Diagnosis not present

## 2017-06-12 DIAGNOSIS — M549 Dorsalgia, unspecified: Secondary | ICD-10-CM | POA: Diagnosis not present

## 2017-06-12 DIAGNOSIS — M1990 Unspecified osteoarthritis, unspecified site: Secondary | ICD-10-CM | POA: Diagnosis not present

## 2017-06-12 DIAGNOSIS — K449 Diaphragmatic hernia without obstruction or gangrene: Secondary | ICD-10-CM | POA: Diagnosis not present

## 2017-06-12 DIAGNOSIS — F419 Anxiety disorder, unspecified: Secondary | ICD-10-CM | POA: Diagnosis not present

## 2017-06-12 DIAGNOSIS — I10 Essential (primary) hypertension: Secondary | ICD-10-CM | POA: Diagnosis not present

## 2017-06-12 LAB — CBC WITH DIFFERENTIAL/PLATELET
BASOS ABS: 0 10*3/uL (ref 0.0–0.1)
BASOS PCT: 0 %
EOS ABS: 0.1 10*3/uL (ref 0.0–0.7)
EOS PCT: 1 %
HCT: 42.8 % (ref 36.0–46.0)
Hemoglobin: 14.3 g/dL (ref 12.0–15.0)
LYMPHS PCT: 27 %
Lymphs Abs: 1.9 10*3/uL (ref 0.7–4.0)
MCH: 29.6 pg (ref 26.0–34.0)
MCHC: 33.4 g/dL (ref 30.0–36.0)
MCV: 88.6 fL (ref 78.0–100.0)
MONO ABS: 0.6 10*3/uL (ref 0.1–1.0)
Monocytes Relative: 8 %
Neutro Abs: 4.6 10*3/uL (ref 1.7–7.7)
Neutrophils Relative %: 64 %
PLATELETS: 266 10*3/uL (ref 150–400)
RBC: 4.83 MIL/uL (ref 3.87–5.11)
RDW: 13.4 % (ref 11.5–15.5)
WBC: 7.1 10*3/uL (ref 4.0–10.5)

## 2017-06-12 LAB — COMPREHENSIVE METABOLIC PANEL
ALBUMIN: 4.3 g/dL (ref 3.5–5.0)
ALT: 17 U/L (ref 14–54)
AST: 17 U/L (ref 15–41)
Alkaline Phosphatase: 64 U/L (ref 38–126)
Anion gap: 7 (ref 5–15)
BUN: 11 mg/dL (ref 6–20)
CHLORIDE: 103 mmol/L (ref 101–111)
CO2: 27 mmol/L (ref 22–32)
Calcium: 9.5 mg/dL (ref 8.9–10.3)
Creatinine, Ser: 0.74 mg/dL (ref 0.44–1.00)
GFR calc Af Amer: >60 mL/min (ref >60)
Glucose, Bld: 97 mg/dL (ref 65–99)
POTASSIUM: 4.1 mmol/L (ref 3.5–5.1)
SODIUM: 137 mmol/L (ref 135–145)
Total Bilirubin: 0.4 mg/dL (ref 0.3–1.2)
Total Protein: 7.9 g/dL (ref 6.5–8.1)

## 2017-06-12 LAB — HCG, SERUM, QUALITATIVE: PREG SERUM: NEGATIVE

## 2017-06-12 NOTE — Progress Notes (Signed)
07-28-16 (Epic) EKG

## 2017-06-12 NOTE — Progress Notes (Addendum)
Pt has history of sinus brady as well as palpitations. Pt saw cardiology on 06-05-17. Cardiology requesting ECHO and Stress test. Per pt, tests are scheduled for 06-23-17, and 06-25-17, which is after the surgery date. Discussed the above with Dr. Twanna HyGermoth, Anesthesiologist. Dr. Twanna HyGermoth is requesting cardiac clearance.    Pt advised of Dr. Twanna HyGermoth decision. Also contacted Dr. Tawana ScaleWilson's office and spoke with Selena BattenKim inTriage to advise of the above. Cardiac clearance request also faxed to Dr. Gwen HerUsman Khawaja, cardiologist.  Pt also stated that she will be stop by cardiologist office in route to work, and have cardiac clearance faxed to us.    Cardiac clearance received, and discussed again with Dr. Twanna HyGermoth. Okay for pt to proceed with surgery.

## 2017-06-15 ENCOUNTER — Ambulatory Visit (HOSPITAL_COMMUNITY)
Admission: RE | Admit: 2017-06-15 | Discharge: 2017-06-15 | Disposition: A | Discharge status: Home / Self Care | Payer: BC Managed Care – PPO | Source: Ambulatory Visit | Attending: General Surgery | Admitting: General Surgery

## 2017-06-15 ENCOUNTER — Encounter (HOSPITAL_COMMUNITY)
Admission: RE | Disposition: A | Discharge status: Home / Self Care | Payer: Self-pay | Source: Ambulatory Visit | Attending: General Surgery

## 2017-06-15 ENCOUNTER — Ambulatory Visit (HOSPITAL_COMMUNITY): Payer: BC Managed Care – PPO | Admitting: Certified Registered"

## 2017-06-15 ENCOUNTER — Ambulatory Visit (HOSPITAL_COMMUNITY): Payer: BC Managed Care – PPO

## 2017-06-15 ENCOUNTER — Encounter (HOSPITAL_COMMUNITY): Payer: Self-pay | Admitting: *Deleted

## 2017-06-15 DIAGNOSIS — I10 Essential (primary) hypertension: Secondary | ICD-10-CM | POA: Insufficient documentation

## 2017-06-15 DIAGNOSIS — K449 Diaphragmatic hernia without obstruction or gangrene: Secondary | ICD-10-CM | POA: Insufficient documentation

## 2017-06-15 DIAGNOSIS — K828 Other specified diseases of gallbladder: Secondary | ICD-10-CM | POA: Insufficient documentation

## 2017-06-15 DIAGNOSIS — M1990 Unspecified osteoarthritis, unspecified site: Secondary | ICD-10-CM | POA: Insufficient documentation

## 2017-06-15 DIAGNOSIS — J45909 Unspecified asthma, uncomplicated: Secondary | ICD-10-CM | POA: Insufficient documentation

## 2017-06-15 DIAGNOSIS — K649 Unspecified hemorrhoids: Secondary | ICD-10-CM | POA: Insufficient documentation

## 2017-06-15 DIAGNOSIS — K811 Chronic cholecystitis: Secondary | ICD-10-CM | POA: Diagnosis not present

## 2017-06-15 DIAGNOSIS — K219 Gastro-esophageal reflux disease without esophagitis: Secondary | ICD-10-CM | POA: Insufficient documentation

## 2017-06-15 DIAGNOSIS — Z79899 Other long term (current) drug therapy: Secondary | ICD-10-CM | POA: Insufficient documentation

## 2017-06-15 DIAGNOSIS — F419 Anxiety disorder, unspecified: Secondary | ICD-10-CM | POA: Insufficient documentation

## 2017-06-15 DIAGNOSIS — Z419 Encounter for procedure for purposes other than remedying health state, unspecified: Secondary | ICD-10-CM

## 2017-06-15 DIAGNOSIS — M549 Dorsalgia, unspecified: Secondary | ICD-10-CM | POA: Insufficient documentation

## 2017-06-15 DIAGNOSIS — Z87891 Personal history of nicotine dependence: Secondary | ICD-10-CM | POA: Insufficient documentation

## 2017-06-15 HISTORY — PX: CHOLECYSTECTOMY: SHX55

## 2017-06-15 SURGERY — LAPAROSCOPIC CHOLECYSTECTOMY WITH INTRAOPERATIVE CHOLANGIOGRAM
Anesthesia: General

## 2017-06-15 MED ORDER — RINGERS IRRIGATION IR SOLN
Status: DC | PRN
  Administered 2017-06-15: 1000 mL

## 2017-06-15 MED ORDER — FENTANYL CITRATE (PF) 250 MCG/5ML IJ SOLN
INTRAMUSCULAR | Status: AC
  Filled 2017-06-15: qty 5

## 2017-06-15 MED ORDER — HYDROMORPHONE HCL 1 MG/ML IJ SOLN: 0.2500 mg | INTRAMUSCULAR | Status: DC | PRN

## 2017-06-15 MED ORDER — EPHEDRINE SULFATE-NACL 50-0.9 MG/10ML-% IV SOSY
PREFILLED_SYRINGE | INTRAVENOUS | Status: DC | PRN
  Administered 2017-06-15: 5 mg via INTRAVENOUS
  Administered 2017-06-15 (×2): 10 mg via INTRAVENOUS

## 2017-06-15 MED ORDER — SCOPOLAMINE 1 MG/3DAYS TD PT72
MEDICATED_PATCH | TRANSDERMAL | Status: AC
  Filled 2017-06-15: qty 1

## 2017-06-15 MED ORDER — SCOPOLAMINE 1 MG/3DAYS TD PT72
MEDICATED_PATCH | TRANSDERMAL | Status: DC | PRN
  Administered 2017-06-15: 1 via TRANSDERMAL

## 2017-06-15 MED ORDER — SODIUM CHLORIDE 0.9% FLUSH: 3.0000 mL | Freq: Two times a day (BID) | INTRAVENOUS | Status: DC

## 2017-06-15 MED ORDER — SODIUM CHLORIDE 0.9 % IV SOLN: 250.0000 mL | INTRAVENOUS | Status: DC | PRN

## 2017-06-15 MED ORDER — BUPIVACAINE-EPINEPHRINE 0.25% -1:200000 IJ SOLN
INTRAMUSCULAR | Status: DC | PRN
  Administered 2017-06-15: 35 mL

## 2017-06-15 MED ORDER — FENTANYL CITRATE (PF) 250 MCG/5ML IJ SOLN
INTRAMUSCULAR | Status: DC | PRN
  Administered 2017-06-15 (×3): 50 ug via INTRAVENOUS
  Administered 2017-06-15: 100 ug via INTRAVENOUS
  Administered 2017-06-15 (×2): 50 ug via INTRAVENOUS
  Administered 2017-06-15: 150 ug via INTRAVENOUS

## 2017-06-15 MED ORDER — PROPOFOL 10 MG/ML IV BOLUS
INTRAVENOUS | Status: AC
  Filled 2017-06-15: qty 20

## 2017-06-15 MED ORDER — ACETAMINOPHEN 500 MG PO TABS
1000.0000 mg | ORAL_TABLET | ORAL | Status: AC
  Administered 2017-06-15: 1000 mg via ORAL
  Filled 2017-06-15: qty 2

## 2017-06-15 MED ORDER — SUGAMMADEX SODIUM 500 MG/5ML IV SOLN
INTRAVENOUS | Status: AC
  Filled 2017-06-15: qty 5

## 2017-06-15 MED ORDER — ACETAMINOPHEN 650 MG RE SUPP
650.0000 mg | RECTAL | Status: DC | PRN
  Filled 2017-06-15: qty 1

## 2017-06-15 MED ORDER — ACETAMINOPHEN 325 MG PO TABS
650.0000 mg | ORAL_TABLET | ORAL | Status: DC | PRN
  Administered 2017-06-15: 650 mg via ORAL

## 2017-06-15 MED ORDER — OXYCODONE HCL 5 MG PO TABS: 5.0000 mg | ORAL_TABLET | ORAL | 0 refills | Status: DC | PRN

## 2017-06-15 MED ORDER — GABAPENTIN 300 MG PO CAPS
300.0000 mg | ORAL_CAPSULE | ORAL | Status: AC
  Administered 2017-06-15: 300 mg via ORAL
  Filled 2017-06-15: qty 1

## 2017-06-15 MED ORDER — LACTATED RINGERS IV SOLN: INTRAVENOUS | Status: DC

## 2017-06-15 MED ORDER — SUCCINYLCHOLINE CHLORIDE 200 MG/10ML IV SOSY
PREFILLED_SYRINGE | INTRAVENOUS | Status: AC
  Filled 2017-06-15: qty 10

## 2017-06-15 MED ORDER — OXYCODONE HCL 5 MG PO TABS: 5.0000 mg | ORAL_TABLET | ORAL | Status: DC | PRN

## 2017-06-15 MED ORDER — LIDOCAINE 2% (20 MG/ML) 5 ML SYRINGE
INTRAMUSCULAR | Status: DC | PRN
  Administered 2017-06-15: 100 mg via INTRAVENOUS

## 2017-06-15 MED ORDER — FENTANYL CITRATE (PF) 100 MCG/2ML IJ SOLN: 25.0000 ug | INTRAMUSCULAR | Status: DC | PRN

## 2017-06-15 MED ORDER — ACETAMINOPHEN 160 MG/5ML PO SOLN
ORAL | Status: AC
  Filled 2017-06-15: qty 20.3

## 2017-06-15 MED ORDER — LIDOCAINE 2% (20 MG/ML) 5 ML SYRINGE
INTRAMUSCULAR | Status: DC | PRN
  Administered 2017-06-15: 1 mg/kg/h via INTRAVENOUS

## 2017-06-15 MED ORDER — KETAMINE HCL 10 MG/ML IJ SOLN
INTRAMUSCULAR | Status: DC | PRN
  Administered 2017-06-15: 20 mg via INTRAVENOUS

## 2017-06-15 MED ORDER — CEFOTETAN DISODIUM-DEXTROSE 2-2.08 GM-%(50ML) IV SOLR
2.0000 g | INTRAVENOUS | Status: AC
  Administered 2017-06-15: 2 g via INTRAVENOUS
  Filled 2017-06-15: qty 50

## 2017-06-15 MED ORDER — PROMETHAZINE HCL 25 MG/ML IJ SOLN: 6.2500 mg | INTRAMUSCULAR | Status: DC | PRN

## 2017-06-15 MED ORDER — LACTATED RINGERS IV SOLN
INTRAVENOUS | Status: DC
  Administered 2017-06-15 (×2): via INTRAVENOUS

## 2017-06-15 MED ORDER — ONDANSETRON HCL 4 MG/2ML IJ SOLN
INTRAMUSCULAR | Status: AC
  Filled 2017-06-15: qty 2

## 2017-06-15 MED ORDER — PHENYLEPHRINE 40 MCG/ML (10ML) SYRINGE FOR IV PUSH (FOR BLOOD PRESSURE SUPPORT)
PREFILLED_SYRINGE | INTRAVENOUS | Status: DC | PRN
  Administered 2017-06-15: 120 ug via INTRAVENOUS
  Administered 2017-06-15: 80 ug via INTRAVENOUS

## 2017-06-15 MED ORDER — MIDAZOLAM HCL 2 MG/2ML IJ SOLN
INTRAMUSCULAR | Status: AC
  Filled 2017-06-15: qty 2

## 2017-06-15 MED ORDER — LACTATED RINGERS IV SOLN: INTRAVENOUS | Status: DC | PRN

## 2017-06-15 MED ORDER — ACETAMINOPHEN 325 MG PO TABS
ORAL_TABLET | ORAL | Status: AC
  Filled 2017-06-15: qty 2

## 2017-06-15 MED ORDER — IOPAMIDOL (ISOVUE-300) INJECTION 61%
INTRAVENOUS | Status: DC | PRN
  Administered 2017-06-15: 3 mL

## 2017-06-15 MED ORDER — SUCCINYLCHOLINE CHLORIDE 200 MG/10ML IV SOSY
PREFILLED_SYRINGE | INTRAVENOUS | Status: DC | PRN
  Administered 2017-06-15: 100 mg via INTRAVENOUS

## 2017-06-15 MED ORDER — CHLORHEXIDINE GLUCONATE 4 % EX LIQD: 60.0000 mL | Freq: Once | CUTANEOUS | Status: DC

## 2017-06-15 MED ORDER — PROPOFOL 10 MG/ML IV BOLUS
INTRAVENOUS | Status: DC | PRN
  Administered 2017-06-15: 200 mg via INTRAVENOUS

## 2017-06-15 MED ORDER — LIDOCAINE 2% (20 MG/ML) 5 ML SYRINGE
INTRAMUSCULAR | Status: AC
  Filled 2017-06-15: qty 20

## 2017-06-15 MED ORDER — ROCURONIUM BROMIDE 10 MG/ML (PF) SYRINGE
PREFILLED_SYRINGE | INTRAVENOUS | Status: AC
  Filled 2017-06-15: qty 15

## 2017-06-15 MED ORDER — ROCURONIUM BROMIDE 10 MG/ML (PF) SYRINGE
PREFILLED_SYRINGE | INTRAVENOUS | Status: DC | PRN
  Administered 2017-06-15: 30 mg via INTRAVENOUS
  Administered 2017-06-15: 10 mg via INTRAVENOUS

## 2017-06-15 MED ORDER — MEPERIDINE HCL 50 MG/ML IJ SOLN: 6.2500 mg | INTRAMUSCULAR | Status: DC | PRN

## 2017-06-15 MED ORDER — IOPAMIDOL (ISOVUE-300) INJECTION 61%
INTRAVENOUS | Status: AC
  Filled 2017-06-15: qty 50

## 2017-06-15 MED ORDER — DEXAMETHASONE SODIUM PHOSPHATE 10 MG/ML IJ SOLN
INTRAMUSCULAR | Status: AC
  Filled 2017-06-15: qty 1

## 2017-06-15 MED ORDER — DEXAMETHASONE SODIUM PHOSPHATE 10 MG/ML IJ SOLN
INTRAMUSCULAR | Status: DC | PRN
  Administered 2017-06-15: 8 mg via INTRAVENOUS

## 2017-06-15 MED ORDER — SODIUM CHLORIDE 0.9% FLUSH: 3.0000 mL | INTRAVENOUS | Status: DC | PRN

## 2017-06-15 MED ORDER — BUPIVACAINE-EPINEPHRINE 0.25% -1:200000 IJ SOLN
INTRAMUSCULAR | Status: AC
  Filled 2017-06-15: qty 1

## 2017-06-15 MED ORDER — MIDAZOLAM HCL 2 MG/2ML IJ SOLN
INTRAMUSCULAR | Status: DC | PRN
  Administered 2017-06-15: 2 mg via INTRAVENOUS

## 2017-06-15 MED ORDER — ACETAMINOPHEN 500 MG PO TABS: 1000.0000 mg | ORAL_TABLET | Freq: Four times a day (QID) | ORAL | Status: DC

## 2017-06-15 SURGICAL SUPPLY — 47 items
APPLICATOR ARISTA FLEXITIP XL (MISCELLANEOUS) IMPLANT
APPLIER CLIP 5 13 M/L LIGAMAX5 (MISCELLANEOUS)
APPLIER CLIP ROT 10 11.4 M/L (STAPLE)
BANDAGE ADH SHEER 1  50/CT (GAUZE/BANDAGES/DRESSINGS) ×8 IMPLANT
BENZOIN TINCTURE PRP APPL 2/3 (GAUZE/BANDAGES/DRESSINGS) IMPLANT
CABLE HIGH FREQUENCY MONO STRZ (ELECTRODE) ×2 IMPLANT
CHLORAPREP W/TINT 26ML (MISCELLANEOUS) ×2 IMPLANT
CLIP APPLIE 5 13 M/L LIGAMAX5 (MISCELLANEOUS) IMPLANT
CLIP APPLIE ROT 10 11.4 M/L (STAPLE) IMPLANT
CLIP VESOLOCK MED LG 6/CT (CLIP) IMPLANT
COVER MAYO STAND STRL (DRAPES) ×2 IMPLANT
COVER SURGICAL LIGHT HANDLE (MISCELLANEOUS) ×2 IMPLANT
DECANTER SPIKE VIAL GLASS SM (MISCELLANEOUS) ×2 IMPLANT
DERMABOND ADVANCED (GAUZE/BANDAGES/DRESSINGS)
DERMABOND ADVANCED .7 DNX12 (GAUZE/BANDAGES/DRESSINGS) IMPLANT
DRAPE C-ARM 42X120 X-RAY (DRAPES) ×2 IMPLANT
DRSG TEGADERM 2-3/8X2-3/4 SM (GAUZE/BANDAGES/DRESSINGS) IMPLANT
ELECT PENCIL ROCKER SW 15FT (MISCELLANEOUS) IMPLANT
ELECT REM PT RETURN 15FT ADLT (MISCELLANEOUS) ×2 IMPLANT
GAUZE SPONGE 2X2 8PLY STRL LF (GAUZE/BANDAGES/DRESSINGS) IMPLANT
GLOVE BIO SURGEON STRL SZ7.5 (GLOVE) ×2 IMPLANT
GLOVE INDICATOR 8.0 STRL GRN (GLOVE) ×2 IMPLANT
GOWN STRL REUS W/TWL XL LVL3 (GOWN DISPOSABLE) ×6 IMPLANT
GRASPER SUT TROCAR 14GX15 (MISCELLANEOUS) IMPLANT
HEMOSTAT ARISTA ABSORB 3G PWDR (MISCELLANEOUS) IMPLANT
HEMOSTAT SNOW SURGICEL 2X4 (HEMOSTASIS) IMPLANT
KIT BASIN OR (CUSTOM PROCEDURE TRAY) ×2 IMPLANT
L-HOOK LAP DISP 36CM (ELECTROSURGICAL)
LHOOK LAP DISP 36CM (ELECTROSURGICAL) IMPLANT
POUCH RETRIEVAL ECOSAC 10 (ENDOMECHANICALS) ×1 IMPLANT
POUCH RETRIEVAL ECOSAC 10MM (ENDOMECHANICALS) ×1
SCISSORS METZENBAUM CVD 33 (INSTRUMENTS) ×2 IMPLANT
SET CHOLANGIOGRAPH MIX (MISCELLANEOUS) IMPLANT
SET IRRIG TUBING LAPAROSCOPIC (IRRIGATION / IRRIGATOR) ×2 IMPLANT
SLEEVE XCEL OPT CAN 5 100 (ENDOMECHANICALS) ×4 IMPLANT
SPONGE GAUZE 2X2 STER 10/PKG (GAUZE/BANDAGES/DRESSINGS)
STRIP CLOSURE SKIN 1/2X4 (GAUZE/BANDAGES/DRESSINGS) IMPLANT
SUT MNCRL AB 4-0 PS2 18 (SUTURE) ×4 IMPLANT
SUT VICRYL 0 TIES 12 18 (SUTURE) IMPLANT
SUT VICRYL 0 UR6 27IN ABS (SUTURE) ×4 IMPLANT
TOWEL OR 17X26 10 PK STRL BLUE (TOWEL DISPOSABLE) ×2 IMPLANT
TOWEL OR NON WOVEN STRL DISP B (DISPOSABLE) ×2 IMPLANT
TRAY LAPAROSCOPIC (CUSTOM PROCEDURE TRAY) ×2 IMPLANT
TROCAR BLADELESS OPT 5 100 (ENDOMECHANICALS) ×2 IMPLANT
TROCAR XCEL BLUNT TIP 100MML (ENDOMECHANICALS) ×2 IMPLANT
TROCAR XCEL NON-BLD 11X100MML (ENDOMECHANICALS) ×2 IMPLANT
TUBING INSUF HEATED (TUBING) ×2 IMPLANT

## 2017-06-15 NOTE — H&P (Signed)
[name] Documented: 06/10/2017 8:30 AM Location: [facility] Patient #: [id number] DOB: April 21, 1976 Married / Language: English / Race: White Female   History of Present Illness [name]([name]; 06/12/2017 8:51 AM) The patient is a 42 year old female who presents with abdominal pain. She comes in with almost a month long issue with right upper quadrant and right upper back pain. She states her pain started in her right mid back and migrated to her right upper quadrant. It is fairly constant but is worse at times. Solid food makes the discomfort worse. She denies any fevers or chills. She denies any melena, hematochezia, or acholic stools. She does take some NSAIDs. She reports that her appetite has diminished since this process started. If she sits more upright for prolonged period time that'll cause some more discomfort in the right upper abdomen. She denies any trauma or aggressive physical activity. She states that she just feels constantly fall in her right upper quadrant. She has been having to take antibiotics to control her nausea. It also is uncomfortable to sleep on her right side. If she eats heavy foods it'll cause worsening right upper quadrant pain. After eating something heavy she will develop right upper quadrant pain within about 30 minutes and that increased discomfort will last 30-45 minutes. She has undergone an extensive workup including ultrasound which showed no gallstones or wall thickening. Her comprehensive metabolic panel was within normal limits. She underwent a CT scan which was unremarkable. She also underwent a nuclear medicine scan of her gallbladder which revealed normal exam as well as normal gallbladder ejection fraction however she states that she had a reproduction of her discomfort with the injection of the CCK. She just had an upper endoscopy yesterday which showed some mild reflux as well as very subtle gastritis but no ulcer. She  states that she has also had some shortness of breath and palpitations since the pain has started and she is currently wearing a Holter monitor  She denies any chest tightness or chest pressure.  Review of systems-conference a 12 point review of systems was performed and all systems are negative except for what is mentioned in HPI   Problem List/Past Medical [name](Veronica Maves M. Veronica CampanileWilson, Nunez; 06/12/2017 8:51 AM) NAUSEA (R11.0)  HISTORY OF REMOVAL OF LAPAROSCOPIC GASTRIC BANDING DEVICE (Z98.84)  RIGHT UPPER QUADRANT PAIN (R10.11)   Past Surgical History [name](Veronica Robinette M. Veronica CampanileWilson, Nunez; 06/12/2017 8:51 AM) Appendectomy  Knee Surgery  Left.  Diagnostic Studies History [name](Veronica Vandehei M. Veronica CampanileWilson, Nunez; 06/12/2017 8:51 AM) Colonoscopy  never Mammogram  within last year Pap Smear  1-5 years ago  Allergies [name](Veronica Zink M. Veronica CampanileWilson, Nunez; 06/12/2017 8:51 AM) Gaetana MichaelisSulfa Antibiotics  Rash  Medication History [name](Veronica Spratley M. Veronica CampanileWilson, Nunez; 06/12/2017 8:51 AM) Lisinopril (20MG  Tablet, Oral daily) Active. Azithromycin (250MG  Tablet, Oral every other day) Active. FLUoxetine HCl (20MG  Capsule, Oral daily) Active.  Social History [name](Veronica Stooksbury M. Veronica CampanileWilson, Nunez; 06/12/2017 8:51 AM) Alcohol use  Moderate alcohol use. Caffeine use  Tea. No drug use  Tobacco use  Former smoker.  Family History [name](Veronica Hallam M. Veronica CampanileWilson, Nunez; 06/12/2017 8:51 AM) Arthritis  Mother. Family history unknown  First Degree Relatives   Pregnancy / Birth History Veronica Sella(Nancee Brownrigg M. Veronica CampanileWilson, Nunez; 06/12/2017 8:51 AM) Age at menarche  10 years. Contraceptive History  Oral contraceptives. Gravida  3 Length (months) of breastfeeding  7-12 Maternal age  44-20 Para  1 Regular periods   Other Problems [name](Nalina Yeatman M. Veronica CampanileWilson, Nunez; 06/12/2017 8:51 AM) Anxiety Disorder  Arthritis  Asthma  Back Pain  Gastroesophageal Reflux Disease  Hemorrhoids  High blood pressure   Vitals [name] [name]; 06/10/2017 8:31 AM) 06/10/2017 8:30 AM Weight: 218 lb Height: 60in Body Surface Area: 1.94 m Body Mass  Index: 42.57 kg/m  Temp.: 98.32F  Pulse: 87 (Regular)  BP: 140/85 (Sitting, Left Arm, Standard)       Physical Exam [name] M. Veronica Pinkard Nunez; 06/10/2017 9:14 AM) General Mental Status-Alert. General Appearance-Consistent with stated age. Hydration-Well hydrated. Voice-Normal.  Head and Neck Head-normocephalic, atraumatic with no lesions or palpable masses. Trachea-midline. Thyroid Gland Characteristics - normal size and consistency.  Eye Eyeball - Bilateral-Normal. Sclera/Conjunctiva - Bilateral-No scleral icterus.  ENMT Ears -Note: normal ext ears.  Mouth and Throat -Note: lips intact.   Chest and Lung Exam Chest and lung exam reveals -quiet, even and easy respiratory effort with no use of accessory muscles and on auscultation, normal breath sounds, no adventitious sounds and normal vocal resonance. Inspection Chest Wall - Normal. Back - normal.  Breast - Did not examine.  Cardiovascular Cardiovascular examination reveals -normal heart sounds, regular rate and rhythm with no murmurs and normal pedal pulses bilaterally.  Abdomen Inspection Inspection of the abdomen reveals - No Hernias. Skin - Scar - Note: well healed trocar scars. Palpation/Percussion Palpation and Percussion of the abdomen reveal - Soft, No Rebound tenderness, No Rigidity (guarding) and No hepatosplenomegaly. Note: mild RUQ TTP. Auscultation Auscultation of the abdomen reveals - Bowel sounds normal.  Peripheral Vascular Upper Extremity Palpation - Pulses bilaterally normal.  Neurologic Neurologic evaluation reveals -alert and oriented x 3 with no impairment of recent or remote memory. Mental Status-Normal.  Neuropsychiatric The patient's mood and affect are described as -normal. Judgment and Insight-insight is appropriate concerning matters relevant to self.  Musculoskeletal Normal Exam - Left-Upper Extremity Strength Normal and Lower Extremity  Strength Normal. Normal Exam - Right-Upper Extremity Strength Normal and Lower Extremity Strength Normal.  Lymphatic Head & Neck  General Head & Neck Lymphatics: Bilateral - Description - Normal. Axillary - Did not examine. Femoral & Inguinal - Did not examine.    Assessment & Plan [name] M. Kimm Ungaro Nunez; 06/10/2017 9:49 AM) RIGHT UPPER QUADRANT PAIN (R10.11) Impression: I believe the patient's symptoms are mostly consistent with gallbladder disease. She essentially had a normal upper endoscopy.  We discussed gallbladder disease. The patient was given Agricultural engineer. We discussed non-operative and operative management. We discussed the signs & symptoms of acute cholecystitis  I discussed laparoscopic cholecystectomy with IOC in detail. The patient was given educational material as well as diagrams detailing the procedure. We discussed the risks and benefits of a laparoscopic cholecystectomy including, but not limited to bleeding, infection, injury to surrounding structures such as the intestine or liver, bile leak, retained gallstones, need to convert to an open procedure, prolonged diarrhea, blood clots such as DVT, common bile duct injury, anesthesia risks, and possible need for additional procedures. We discussed the typical post-operative recovery course. I explained that the likelihood of improvement of their symptoms is moderate.  We did discuss the possibility that cholecystectomy may not ameliorate her symptoms  The patient has elected to proceed with surgery. Current Plans Pt Education - Pamphlet Given - Laparoscopic Gallbladder Surgery  Veronica Sella. Veronica Campanile, Nunez, FACS General, Bariatric, & Minimally Invasive Surgery [facility], [place]

## 2017-06-15 NOTE — Anesthesia Procedure Notes (Signed)
Procedure Name: Intubation Date/Time: 06/15/2017 10:41 AM Performed by: Minerva EndsMirarchi, Eivan Gallina M, CRNA Pre-anesthesia Checklist: Patient identified, Emergency Drugs available, Suction available and Patient being monitored Patient Re-evaluated:Patient Re-evaluated prior to induction Oxygen Delivery Method: Circle System Utilized Preoxygenation: Pre-oxygenation with 100% oxygen Induction Type: IV induction Ventilation: Mask ventilation without difficulty Laryngoscope Size: Miller and 2 Grade View: Grade I Tube type: Oral Number of attempts: 1 Airway Equipment and Method: Stylet Placement Confirmation: ETT inserted through vocal cords under direct vision,  positive ETCO2 and breath sounds checked- equal and bilateral Secured at: 21 cm Tube secured with: Tape Dental Injury: Teeth and Oropharynx as per pre-operative assessment  Comments: Smooth IV induction Green-- intubation AM CRNA atraumatic-- teeth and mouth as preop -- bilat BS Gren

## 2017-06-15 NOTE — Transfer of Care (Signed)
Immediate Anesthesia Transfer of Care Note  Patient: [name]  Procedure(s) Performed: LAPAROSCOPIC CHOLECYSTECTOMY WITH INTRAOPERATIVE CHOLANGIOGRAM (N/A )  Patient Location: PACU  Anesthesia Type:General  Level of Consciousness: awake and alert   Airway & Oxygen Therapy: Patient Spontanous Breathing and Patient connected to face mask oxygen  Post-op Assessment: Report given to RN and Post -op Vital signs reviewed and stable  Post vital signs: Reviewed and stable  Last Vitals:  Vitals:   06/15/17 0738 06/15/17 1200  BP: 112/74   Pulse: 68   Resp: 16 14  Temp: 36.7 C   SpO2: 99%     Last Pain:  Vitals:   06/15/17 0738  TempSrc: Oral      Patients Stated Pain Goal: 4 (06/15/17 0747)  Complications: No apparent anesthesia complications

## 2017-06-15 NOTE — Op Note (Signed)
[name] [id number] 01-01-1976 06/15/2017  Laparoscopic Cholecystectomy with IOC Procedure Note  Indications: This patient presents with symptomatic RUQ pain and will undergo laparoscopic cholecystectomy. Please see h&P for additional information.   Pre-operative Diagnosis: Abdominal pain, right upper quadrant  Post-operative Diagnosis: Same  Surgeon: Gaynelle Adu MD FACS  Assistants: none  Anesthesia: General endotracheal anesthesia  Procedure Details  The patient was seen again in the Holding Room. The risks, benefits, complications, treatment options, and expected outcomes were discussed with the patient. The possibilities of reaction to medication, pulmonary aspiration, perforation of viscus, bleeding, recurrent infection, finding a normal gallbladder, the need for additional procedures, failure to diagnose a condition, the possible need to convert to an open procedure, and creating a complication requiring transfusion or operation were discussed with the patient. The likelihood of improving the patient's symptoms with return to their baseline status is good.  The patient and/or family concurred with the proposed plan, giving informed consent. The site of surgery properly noted. The patient was taken to Operating Room, identified as [name] and the procedure verified as Laparoscopic Cholecystectomy with Intraoperative Cholangiogram. A Time Out was held and the above information confirmed. Antibiotic prophylaxis was administered.   Prior to the induction of general anesthesia, antibiotic prophylaxis was administered. General endotracheal anesthesia was then administered and tolerated well. After the induction, the abdomen was prepped with Chloraprep and draped in the sterile fashion. The patient was positioned in the supine position.  Local anesthetic agent was injected into the skin near the umbilicus and an incision made. We dissected down to the abdominal fascia with blunt  dissection.  The fascia was incised vertically and we entered the peritoneal cavity bluntly.  A pursestring suture of 0-Vicryl was placed around the fascial opening.  The Hasson cannula was inserted and secured with the stay suture.  Pneumoperitoneum was then created with CO2 and tolerated well without any adverse changes in the patient's vital signs. An 5-mm port was placed in the subxiphoid position.  Two 5-mm ports were placed in the right upper quadrant. All skin incisions were infiltrated with a local anesthetic agent before making the incision and placing the trocars.   We positioned the patient in reverse Trendelenburg, tilted slightly to the patient's left.  The gallbladder was identified, the fundus grasped and retracted cephalad. Adhesions were lysed bluntly and with the electrocautery where indicated, taking care not to injure any adjacent organs or viscus. The infundibulum was grasped and retracted laterally, exposing the peritoneum overlying the triangle of Calot. This was then divided and exposed in a blunt fashion. A critical view of the cystic duct and cystic artery was obtained.  The cystic duct was clearly identified and bluntly dissected circumferentially. The cystic duct was ligated with a clip distally.   An incision was made in the cystic duct and the [facility] cholangiogram catheter introduced. The catheter was secured using a clip. A cholangiogram was then obtained which showed good visualization of the distal and proximal biliary tree with no sign of filling defects or obstruction.  Contrast flowed easily into the duodenum. The catheter was then removed.   The cystic duct was then ligated with clips and divided. The cystic artery (both an anterior and posterior branch) which had been identified & dissected free was ligated with clips and divided as well.   The gallbladder was dissected from the liver bed in retrograde fashion with the electrocautery. The gallbladder was removed and placed  in an Ecco sac.  The  gallbladder and Ecco sac were then removed through the umbilical port site. The liver bed was irrigated and inspected. Hemostasis was achieved with the electrocautery. Copious irrigation was utilized and was repeatedly aspirated until clear.  The pursestring suture was used to close the umbilical fascia.    We again inspected the right upper quadrant for hemostasis.  The umbilical closure was inspected and there was no air leak and nothing trapped within the closure. An additional interrupted 0 vicryl suture was placed using the PMI suture passer with laparoscopic guidance. Pneumoperitoneum was released as we removed the trocars.  4-0 Monocryl was used to close the skin.   Benzoin, steri-strips, and clean dressings were applied. The patient was then extubated and brought to the recovery room in stable condition. Instrument, sponge, and needle counts were correct at closure and at the conclusion of the case.   Findings: Omental adhesions to the body of the gallbladder  Estimated Blood Loss: Minimal         Drains: none         Specimens: Gallbladder           Complications: None; patient tolerated the procedure well.         Disposition: PACU - hemodynamically stable.         Condition: stable  Veronica SellaEric M. Andrey CampanileWilson, MD, FACS General, Bariatric, & Minimally Invasive Surgery [facility] [place] ]

## 2017-06-15 NOTE — Anesthesia Procedure Notes (Signed)
Procedures

## 2017-06-15 NOTE — Anesthesia Postprocedure Evaluation (Signed)
Anesthesia Post Note  Patient: [name]  Procedure(s) Performed: LAPAROSCOPIC CHOLECYSTECTOMY WITH INTRAOPERATIVE CHOLANGIOGRAM (N/A )     Patient location during evaluation: PACU Anesthesia Type: General Level of consciousness: awake and alert Pain management: pain level controlled Vital Signs Assessment: post-procedure vital signs reviewed and stable Respiratory status: spontaneous breathing, nonlabored ventilation, respiratory function stable and patient connected to nasal cannula oxygen Cardiovascular status: blood pressure returned to baseline and stable Postop Assessment: no apparent nausea or vomiting Anesthetic complications: no    Last Vitals:  Vitals:   06/15/17 1243 06/15/17 1318  BP: 132/80   Pulse: 73   Resp: 16   Temp: 36.6 C 36.7 C  SpO2:  98%    Last Pain:  Vitals:   06/15/17 1318  TempSrc:   PainSc: 3                  Shelton SilvasKevin D Kary Sugrue

## 2017-06-15 NOTE — Anesthesia Preprocedure Evaluation (Addendum)
Anesthesia Evaluation  Patient identified by MRN, date of birth, ID band Patient awake    Reviewed: Allergy & Precautions, NPO status , Patient's Chart, lab work & pertinent test results  History of Anesthesia Complications (+) PONV  Airway Mallampati: II  TM Distance: >3 FB     Dental  (+) Dental Advisory Given   Pulmonary asthma , former smoker,    breath sounds clear to auscultation       Cardiovascular hypertension,  Rhythm:Regular Rate:Normal     Neuro/Psych    GI/Hepatic Neg liver ROS, hiatal hernia, GERD  ,  Endo/Other  negative endocrine ROS  Renal/GU negative Renal ROS     Musculoskeletal   Abdominal   Peds  Hematology   Anesthesia Other Findings   Reproductive/Obstetrics                           Anesthesia Physical Anesthesia Plan  ASA: III  Anesthesia Plan: General   Post-op Pain Management:    Induction: Intravenous  PONV Risk Score and Plan: 4 or greater and Treatment may vary due to age or medical condition, Ondansetron, Dexamethasone, Midazolam and Propofol infusion  Airway Management Planned: Oral ETT  Additional Equipment:   Intra-op Plan:   Post-operative Plan: Extubation in OR  Informed Consent: I have reviewed the patients History and Physical, chart, labs and discussed the procedure including the risks, benefits and alternatives for the proposed anesthesia with the patient or authorized representative who has indicated his/her understanding and acceptance.   Dental advisory given  Plan Discussed with: CRNA and Anesthesiologist  Anesthesia Plan Comments:        Anesthesia Quick Evaluation

## 2017-06-15 NOTE — Interval H&P Note (Signed)
History and Physical Interval Note:  06/15/2017 9:36 AM  Yvonne KendallBobbi Roselee CulverM Lemanski  has presented today for surgery, with the diagnosis of right upper quadrant pain  The various methods of treatment have been discussed with the patient and family. After consideration of risks, benefits and other options for treatment, the patient has consented to  Procedure(s): LAPAROSCOPIC CHOLECYSTECTOMY WITH POSSIBLE INTRAOPERATIVE CHOLANGIOGRAM (N/A) as a surgical intervention .  The patient's history has been reviewed, patient examined, no change in status, stable for surgery.  I have reviewed the patient's chart and labs.  Questions were answered to the patient's satisfaction.    Mary SellaEric M. Andrey CampanileWilson, MD, FACS General, Bariatric, & Minimally Invasive Surgery [facility] [place]   Gaynelle AduEric Idelle Reimann

## 2017-06-15 NOTE — Discharge Instructions (Signed)
[facility] LAPAROSCOPIC SURGERY: POST OP INSTRUCTIONS Always review your discharge instruction sheet given to you by the facility where your surgery was performed. IF YOU HAVE DISABILITY OR FAMILY LEAVE FORMS, YOU MUST BRING THEM TO THE OFFICE FOR PROCESSING.   DO NOT GIVE THEM TO YOUR DOCTOR.  1. A prescription for pain medication may be given to you upon discharge.  First, take acetaminophen (Tylenol) AND/OR or ibuprofen (Advil) for pain. If you still have pain, then you may take the prescription pain medication.  2. If you need a refill on your pain medication, please contact your pharmacy.  They will contact our office to request authorization. Prescriptions will not be filled after 5pm or on week-ends. 3. You should follow a light diet the first few days after arrival home, such as soup and crackers, etc.  Be sure to include lots of fluids daily. 4. Most patients will experience some swelling and bruising in the area of the incisions.  Ice packs will help.  Swelling and bruising can take several days to resolve.  5. It is common to experience some constipation if taking pain medication after surgery.  Increasing fluid intake and taking a stool softener (such as Colace) will usually help or prevent this problem from occurring.  A mild laxative (Milk of Magnesia or Miralax) should be taken according to package instructions if there are no bowel movements after 48 hours. 6. Unless discharge instructions indicate otherwise, you may remove your bandages 48 hours after surgery, and you may shower at that time.  You  have steri-strips (small skin tapes) in place directly over the incision.  These strips should be left on the skin for 7-10 days.  7. ACTIVITIES:  You may resume regular (light) daily activities beginning the next day--such as daily self-care, walking, climbing stairs--gradually increasing activities as tolerated.  You may have sexual intercourse when it is comfortable.   Refrain from any heavy lifting or straining until approved by your doctor. a. You may drive when you are no longer taking prescription pain medication, you can comfortably wear a seatbelt, and you can safely maneuver your car and apply brakes. 8. You should see your doctor in the office for a follow-up appointment approximately 2-3 weeks after your surgery.  Make sure that you call for this appointment within a day or two after you arrive home to insure a convenient appointment time. 9. OTHER INSTRUCTIONS:   WHEN TO CALL YOUR DOCTOR: 1. Fever over 101.0 2. Inability to urinate 3. Continued bleeding from incision. 4. Increased pain, redness, or drainage from the incision. 5. Increasing abdominal pain  The clinic staff is available to answer your questions during regular business hours.  Please dont hesitate to call and ask to speak to one of the nurses for clinical concerns.  If you have a medical emergency, go to the nearest emergency room or call 911.  A surgeon from [facility] is always on call at the hospital. [address], [place], [place]  [address] ? [address] [address], [place], [place]   [address] [phone] ? [phone] ? FAX [phone] Web site: [email or web address]  General Anesthesia, Adult, Care After These instructions provide you with information about caring for yourself after your procedure. Your health care provider may also give you more specific instructions. Your treatment has been planned according to current medical practices, but problems sometimes occur. Call your health care provider if you have any problems or questions after your  procedure. What can I expect after the procedure? After the procedure, it is common to have:  Vomiting.  A sore throat.  Mental slowness.  It is common to feel:  Nauseous.  Cold or shivery.  Sleepy.  Tired.  Sore or achy, even in parts of your body where you did not have  surgery.  Follow these instructions at home: For at least 24 hours after the procedure:  Do not: ? Participate in activities where you could fall or become injured. ? Drive. ? Use heavy machinery. ? Drink alcohol. ? Take sleeping pills or medicines that cause drowsiness. ? Make important decisions or sign legal documents. ? Take care of children on your own.  Rest. Eating and drinking  If you vomit, drink water, juice, or soup when you can drink without vomiting.  Drink enough fluid to keep your urine clear or pale yellow.  Make sure you have little or no nausea before eating solid foods.  Follow the diet recommended by your health care provider. General instructions  Have a responsible adult stay with you until you are awake and alert.  Return to your normal activities as told by your health care provider. Ask your health care provider what activities are safe for you.  Take over-the-counter and prescription medicines only as told by your health care provider.  If you smoke, do not smoke without supervision.  Keep all follow-up visits as told by your health care provider. This is important. Contact a health care provider if:  You continue to have nausea or vomiting at home, and medicines are not helpful.  You cannot drink fluids or start eating again.  You cannot urinate after 8-12 hours.  You develop a skin rash.  You have fever.  You have increasing redness at the site of your procedure. Get help right away if:  You have difficulty breathing.  You have chest pain.  You have unexpected bleeding.  You feel that you are having a life-threatening or urgent problem. This information is not intended to replace advice given to you by your health care provider. Make sure you discuss any questions you have with your health care provider. Document Released: 08/04/2000 Document Revised: 10/01/2015 Document Reviewed: 04/12/2015 Elsevier Interactive Patient Education   [employer]2018 [employer]

## 2017-06-15 NOTE — Anesthesia Procedure Notes (Signed)
Date/Time: 06/15/2017 11:49 AM Performed by: Minerva EndsMirarchi, Keilin Gamboa M, CRNA Pre-anesthesia Checklist: Suction available Oxygen Delivery Method: Simple face mask Placement Confirmation: positive ETCO2 and breath sounds checked- equal and bilateral Dental Injury: Teeth and Oropharynx as per pre-operative assessment

## 2018-06-12 ENCOUNTER — Other Ambulatory Visit: Payer: Self-pay

## 2018-06-12 ENCOUNTER — Emergency Department (HOSPITAL_COMMUNITY)
Admission: EM | Admit: 2018-06-12 | Discharge: 2018-06-12 | Disposition: A | Discharge status: Home / Self Care | Payer: BC Managed Care – PPO | Attending: Emergency Medicine | Admitting: Emergency Medicine

## 2018-06-12 ENCOUNTER — Emergency Department (HOSPITAL_COMMUNITY): Payer: BC Managed Care – PPO

## 2018-06-12 ENCOUNTER — Encounter (HOSPITAL_COMMUNITY): Payer: Self-pay | Admitting: Emergency Medicine

## 2018-06-12 DIAGNOSIS — Z87891 Personal history of nicotine dependence: Secondary | ICD-10-CM | POA: Insufficient documentation

## 2018-06-12 DIAGNOSIS — I10 Essential (primary) hypertension: Secondary | ICD-10-CM | POA: Diagnosis not present

## 2018-06-12 DIAGNOSIS — S63105A Unspecified dislocation of left thumb, initial encounter: Secondary | ICD-10-CM | POA: Diagnosis not present

## 2018-06-12 DIAGNOSIS — Z79899 Other long term (current) drug therapy: Secondary | ICD-10-CM | POA: Diagnosis not present

## 2018-06-12 DIAGNOSIS — Y999 Unspecified external cause status: Secondary | ICD-10-CM | POA: Insufficient documentation

## 2018-06-12 DIAGNOSIS — J45909 Unspecified asthma, uncomplicated: Secondary | ICD-10-CM | POA: Diagnosis not present

## 2018-06-12 DIAGNOSIS — W182XXA Fall in (into) shower or empty bathtub, initial encounter: Secondary | ICD-10-CM | POA: Diagnosis not present

## 2018-06-12 DIAGNOSIS — Y929 Unspecified place or not applicable: Secondary | ICD-10-CM | POA: Diagnosis not present

## 2018-06-12 DIAGNOSIS — Y93E1 Activity, personal bathing and showering: Secondary | ICD-10-CM | POA: Diagnosis not present

## 2018-06-12 MED ORDER — LIDOCAINE HCL 2 % IJ SOLN
10.0000 mL | Freq: Once | INTRAMUSCULAR | Status: AC
  Administered 2018-06-12: 200 mg
  Filled 2018-06-12: qty 20

## 2018-06-12 NOTE — ED Notes (Signed)
Pt given discharge instructions and follow up information. Pt given the opportunity to answer questions. Pt verbalized understanding.

## 2018-06-12 NOTE — ED Triage Notes (Signed)
Per Pt she was at home and was getting out of the shower and slipped and fell and states that she dislocated her left thumb.  She had an xray today that showed the same and while there was given 60mg  torodol IM.  NAD noted at this time AOx4.

## 2018-06-12 NOTE — ED Provider Notes (Signed)
  Face-to-face evaluation   History: Presents for evaluation of injury to left thumb, which she fell, earlier today.  Went to an urgent care was referred to the ED for treatment.  Physical exam: Alert, calm, cooperative.  Left hand with deformity at first MCP joint.  Rest intact distally in the left thumb.,  And nontender MCP and IP joints of the thumb.  Medical screening examination/treatment/procedure(s) were conducted as a shared visit with non-physician practitioner(s) and myself.  I personally evaluated the patient during the encounter   Mancel Bale, MD 06/12/18 1900

## 2018-06-12 NOTE — ED Provider Notes (Signed)
[facility] Provider Note   CSN: [id number] Arrival date & time: 06/12/18  1121     History   Chief Complaint Chief Complaint  Patient presents with  . Finger Injury    HPI [name] is a 43 y.o. female.  43 year old female presents to the emergency room with left hand pain.  Patient states that she is slipped and fell getting out of the shower last night and fell back into the bathtub landing on her outstretched left hand.  Patient went to urgent care where she had an x-ray done, x-ray report reads dislocated first metacarpal.  Patient was given 60 mg of IM Toradol and sent to the ER for reduction.  Patient last ate just prior to arrival in the emergency room.  Patient is right-hand dominant.  No other injuries or concerns.     Past Medical History:  Diagnosis Date  . Anxiety   . Arthritis   . Asthma    excercise induced and cold air  . GERD (gastroesophageal reflux disease)   . History of hiatal hernia   . Hypertension   . PONV (postoperative nausea and vomiting)     There are no active problems to display for this patient.   Past Surgical History:  Procedure Laterality Date  . APPENDECTOMY     2009  . CHOLECYSTECTOMY N/A 06/15/2017   Procedure: LAPAROSCOPIC CHOLECYSTECTOMY WITH INTRAOPERATIVE CHOLANGIOGRAM;  Surgeon: Gaynelle AduWilson, Eric, MD;  Location: [facility] OR[facility];  Service: General;  Laterality: N/A;  . DILATION AND CURETTAGE OF UTERUS     times 2  . GASTRIC BANDING PORT REVISION  07/2016   Removal  . KNEE ARTHROSCOPY     Left  . LAPAROSCOPIC GASTRIC BANDING     2011  . LAPAROSCOPIC REPAIR AND REMOVAL OF GASTRIC BAND N/A 07/29/2016   Procedure: LAPAROSCOPIC REPAIR AND REMOVAL OF GASTRIC BAND;  Surgeon: Gaynelle AduEric [name], MD;  Location: [facility];  Service: General;  Laterality: N/A;     OB History   No obstetric history on file.      Home Medications    Prior to Admission medications   Medication Sig Start Date End Date Taking?  Authorizing Provider  FLUoxetine (PROZAC) 20 MG capsule Take 20 mg by mouth daily.   Yes [provider]  fluticasone (FLONASE) 50 MCG/ACT nasal spray Place 1 spray into both nostrils daily.    Yes [provider]  gabapentin (NEURONTIN) 300 MG capsule Take 300 mg by mouth at bedtime as needed. 11/23/17  Yes [provider]  lisinopril (PRINIVIL,ZESTRIL) 20 MG tablet Take 20 mg by mouth at bedtime. 07/17/16  Yes [provider]  meloxicam (MOBIC) 7.5 MG tablet Take 7.5 mg by mouth daily as needed for pain. 05/21/18  Yes [provider]  ondansetron (ZOFRAN) 4 MG tablet Take 4 mg by mouth every 8 (eight) hours as needed for nausea or vomiting.  07/16/16  Yes [provider]  atorvastatin (LIPITOR) 20 MG tablet Take 20 mg by mouth daily.  05/01/17 05/01/18  [provider]    Family History History reviewed. No pertinent family history.  Social History Social History   Tobacco Use  . Smoking status: Former Smoker    Packs/day: 0.25    Years: 25.00    Pack years: 6.25    Types: Cigarettes    Last attempt to quit: 2007    Years since quitting: 13.0  . Smokeless tobacco: Never Used  Substance Use Topics  .  Alcohol use: Yes    Alcohol/week: 1.0 standard drinks    Types: 1 Shots of liquor per week    Comment: daily/last used 2 weeks ago 1 shot nightly   . Drug use: Yes    Types: Marijuana    Comment: last used 07/27/2016      Allergies   Doxycycline and Sulfa antibiotics   Review of Systems Review of Systems  Constitutional: Negative for fever.  Musculoskeletal: Positive for arthralgias, joint swelling and myalgias.  Skin: Negative for color change, rash and wound.  Allergic/Immunologic: Negative for immunocompromised state.  Neurological: Negative for weakness and numbness.  Hematological: Does not bruise/bleed easily.  Psychiatric/Behavioral: Negative for confusion.  All other systems reviewed and are  negative.    Physical Exam Updated Vital Signs BP 131/62   Pulse 74   Temp (!) 97.5 F (36.4 C) (Oral)   Resp (!) 23   Ht 5' (1.524 m)   Wt 103.9 kg   LMP 04/30/2018 (Exact Date)   SpO2 98%   BMI 44.72 kg/m   Physical Exam Vitals signs and nursing note reviewed.  Constitutional:      General: She is not in acute distress.    Appearance: She is well-developed. She is not diaphoretic.  HENT:     Head: Normocephalic and atraumatic.  Cardiovascular:     Pulses: Normal pulses.  Pulmonary:     Effort: Pulmonary effort is normal.  Musculoskeletal:        General: Tenderness and deformity present.     Left hand: She exhibits decreased range of motion, tenderness, bony tenderness, deformity and swelling. She exhibits normal capillary refill and no laceration. Normal sensation noted.       Hands:  Skin:    General: Skin is warm and dry.     Findings: No erythema or rash.  Neurological:     Mental Status: She is alert and oriented to person, place, and time.  Psychiatric:        Behavior: Behavior normal.      ED Treatments / Results  Labs (all labs ordered are listed, but only abnormal results are displayed) Labs Reviewed - No data to display  EKG EKG Interpretation  Date/Time:  Saturday June 12 2018 13:04:27 EST Ventricular Rate:  72 PR Interval:    QRS Duration: 98 QT Interval:  417 QTC Calculation: 457 R Axis:   51 Text Interpretation:  Sinus rhythm Multiple ventricular premature complexes Since last tracing pvcs are new Confirmed by Mancel Bale [phone]) on 06/12/2018 1:08:05 PM Also confirmed by Mancel Bale [phone]), editor Barbette Hair [phone])  on 06/12/2018 2:37:52 PM   Radiology Dg Hand Complete Left  Result Date: 06/12/2018 CLINICAL DATA:  The patient suffered a left hand injury last night when she fell in bathtub. Initial encounter. EXAM: LEFT HAND - COMPLETE 3+ VIEW COMPARISON:  None. FINDINGS: There is no evidence of fracture. Mild radial  subluxation of the thumb at the first MCP joint is noted. There is no evidence of arthropathy or other focal bone abnormality. Soft tissues are unremarkable. IMPRESSION: Mild radial subluxation of the thumb at the first MCP joint. Negative for fracture. Electronically Signed   By: Drusilla Kanner M.D.   On: 06/12/2018 12:14   Dg Finger Thumb Left  Result Date: 06/12/2018 CLINICAL DATA:  Post reduction EXAM: LEFT THUMB 2+V COMPARISON:  Left hand radiographs-earlier same date FINDINGS: Interval reduction of previously noted dislocation/subluxation involving the base of the first metacarpal. Alignment now appears anatomic. No  associated fracture. Joint spaces are preserved. No erosions. Minimal amount of expected adjacent soft tissue swelling persists. No radiopaque foreign body. IMPRESSION: Uncomplicated reduction of dislocation/subluxation of the base of the first metacarpal. Electronically Signed   By: Simonne Come M.D.   On: 06/12/2018 14:33    Procedures Reduction of dislocation Date/Time: 06/12/2018 1:52 PM Performed by: Jeannie Fend, PA-C Authorized by: Jeannie Fend, PA-C  Consent: Verbal consent obtained. Risks and benefits: risks, benefits and alternatives were discussed Consent given by: patient Imaging studies: imaging studies available Patient identity confirmed: verbally with patient Time out: Immediately prior to procedure a "time out" was called to verify the correct patient, procedure, equipment, support staff and site/side marked as required. Local anesthesia used: yes Anesthesia: hematoma block  Anesthesia: Local anesthesia used: yes Local Anesthetic: lidocaine 2% without epinephrine Anesthetic total: 10 mL  Sedation: Patient sedated: no  Patient tolerance: Patient tolerated the procedure well with no immediate complications    (including critical care time)  Medications Ordered in ED Medications  lidocaine (XYLOCAINE) 2 % (with pres) injection 200 mg (200 mg  Infiltration Given 06/12/18 1257)     Initial Impression / Assessment and Plan / ED Course  I have reviewed the triage vital signs and the nursing notes.  Pertinent labs & imaging results that were available during my care of the patient were reviewed by me and considered in my medical decision making (see chart for details).  Clinical Course as of Jun 13 1503  Sat Jun 12, 2018  2960 43 year old female presents with complaint of left thumb dislocation.  Patient states she slipped while getting up tub last night and fell injuring her left thumb.  Patient went urgent care today was told that her thumb was dislocated and she had to come to the ER to have this reduced.  On exam patient has deformity or tenderness at the left first MCP.  Case was discussed with Dr. Orlan Leavens, on-call for hand, recommends hematoma block and reduction in the ER.  Patient tolerated block well, reduced with out complication.  Postreduction film shows successful reduction.  Patient was placed a thumb spica splint and advised to follow-up with orthopedics.  Incidentally while in the ER patient's heart rate was irregular, she states that she was recently seen by cardiology through [facility], had a Holter monitor as well as stress test and was told everything was normal.  EKG shows PVCs.  Patient was given a copy of her EKG, she states that she may have been told at one point that she had irregular heartbeats.  Patient plans to follow-up with her cardiologist regarding EKG findings today to confirm this is similar to her prior EKGs.  He does not have any chest pain or shortness of breath, no other complaints.   [LM]    Clinical Course User Index [LM] Jeannie Fend, PA-C   Final Clinical Impressions(s) / ED Diagnoses   Final diagnoses:  Thumb dislocation, left, initial encounter    ED Discharge Orders    None       Jeannie Fend, PA-C 06/12/18 1506    Mancel Bale, MD 06/12/18 1859

## 2018-06-12 NOTE — Discharge Instructions (Addendum)
Keep splint on, keep splint clean and dry. You may apply ice to your thumb/splint for 20 minutes at a time. Follow up with orthopedics, referral given, call Monday to schedule an appointment.  Take Motrin/Tylenol as needed as directed for pain.

## 2018-06-12 NOTE — ED Notes (Signed)
Patient transported to X-ray 

## 2019-07-29 IMAGING — RF DG CHOLANGIOGRAM OPERATIVE
1 series · 4 of 4 positions shown · non-contrast
Comparison: None.

CLINICAL DATA: 41-year-old female with a history cholelithiasis

EXAM:
INTRAOPERATIVE CHOLANGIOGRAM
TECHNIQUE: Cholangiographic images from the C-arm fluoroscopic device were
submitted for interpretation post-operatively. Please see the
procedural report for the amount of contrast and the fluoroscopy
time utilized.

[Series 1: run · 4 of 42 frames shown]
[frame 1/42]
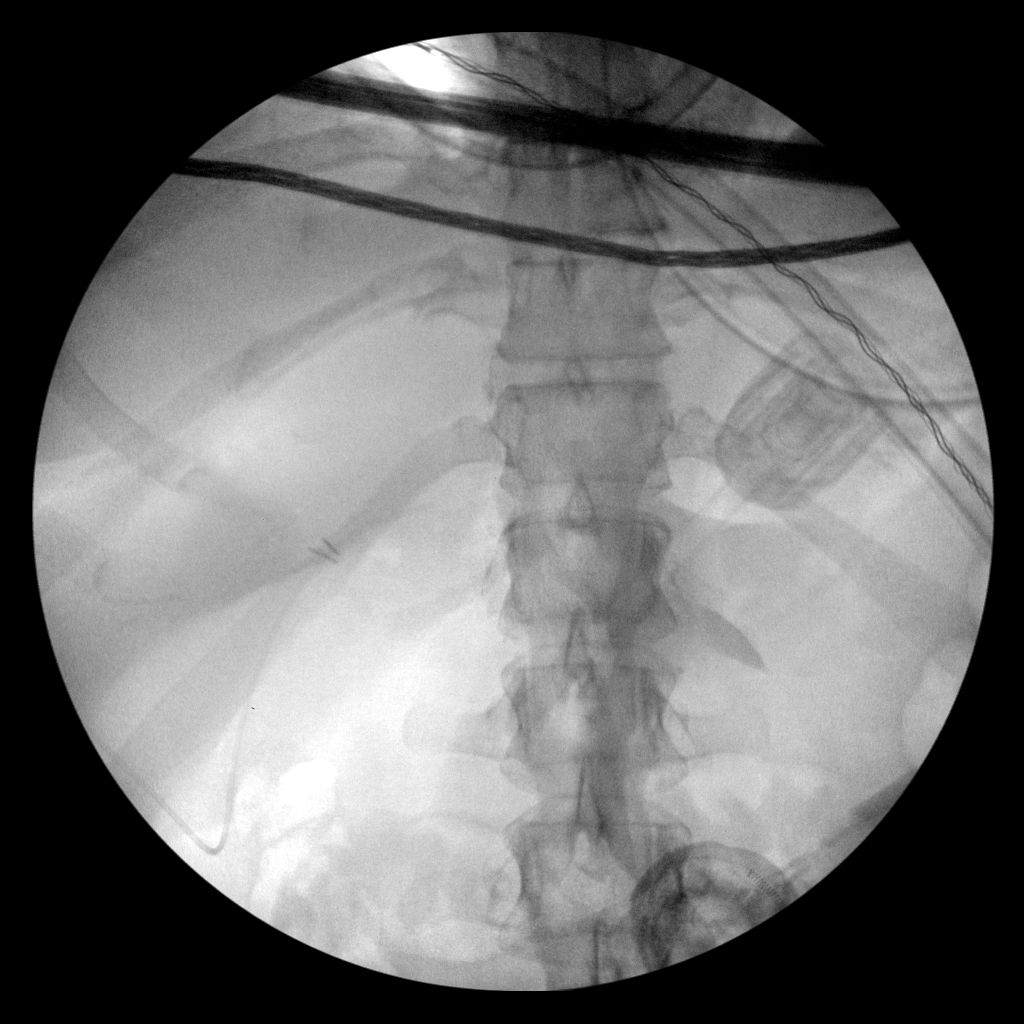
[frame 7/42]
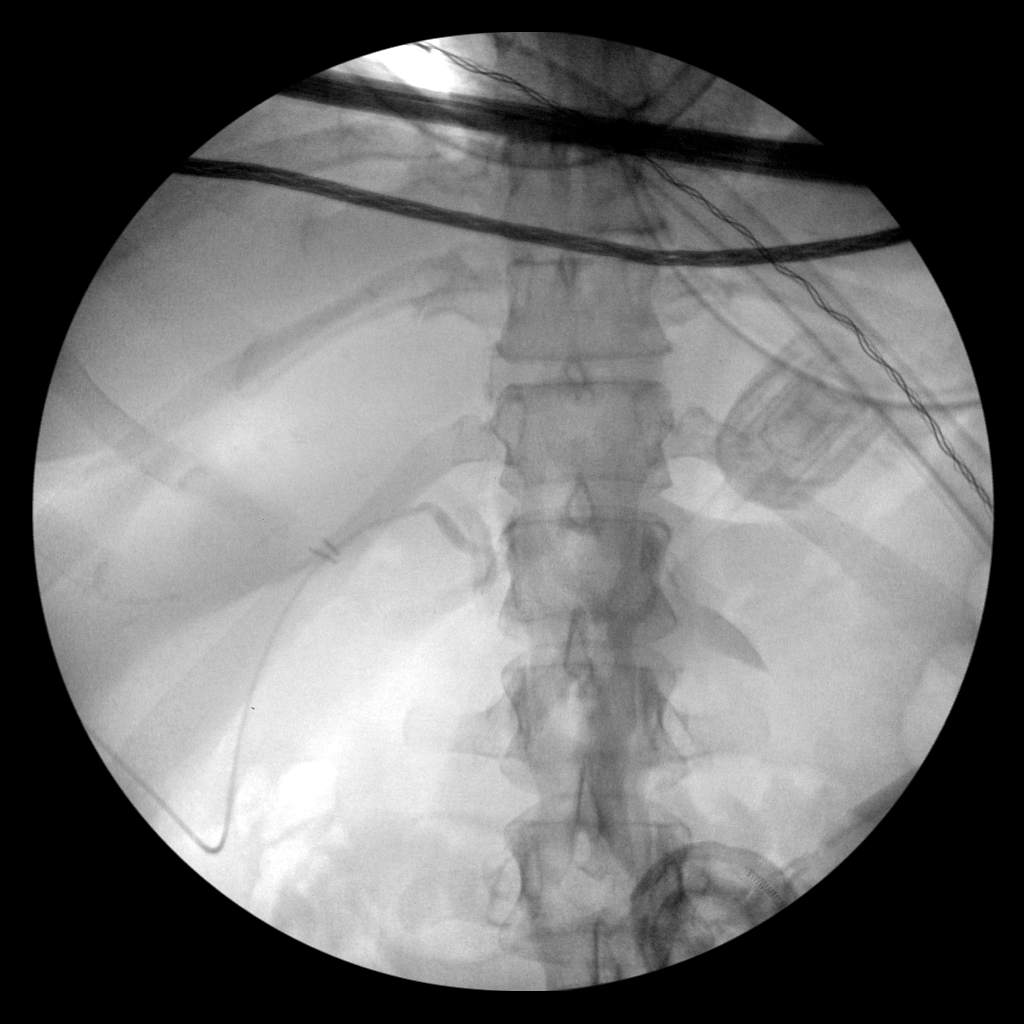
[frame 22/42]
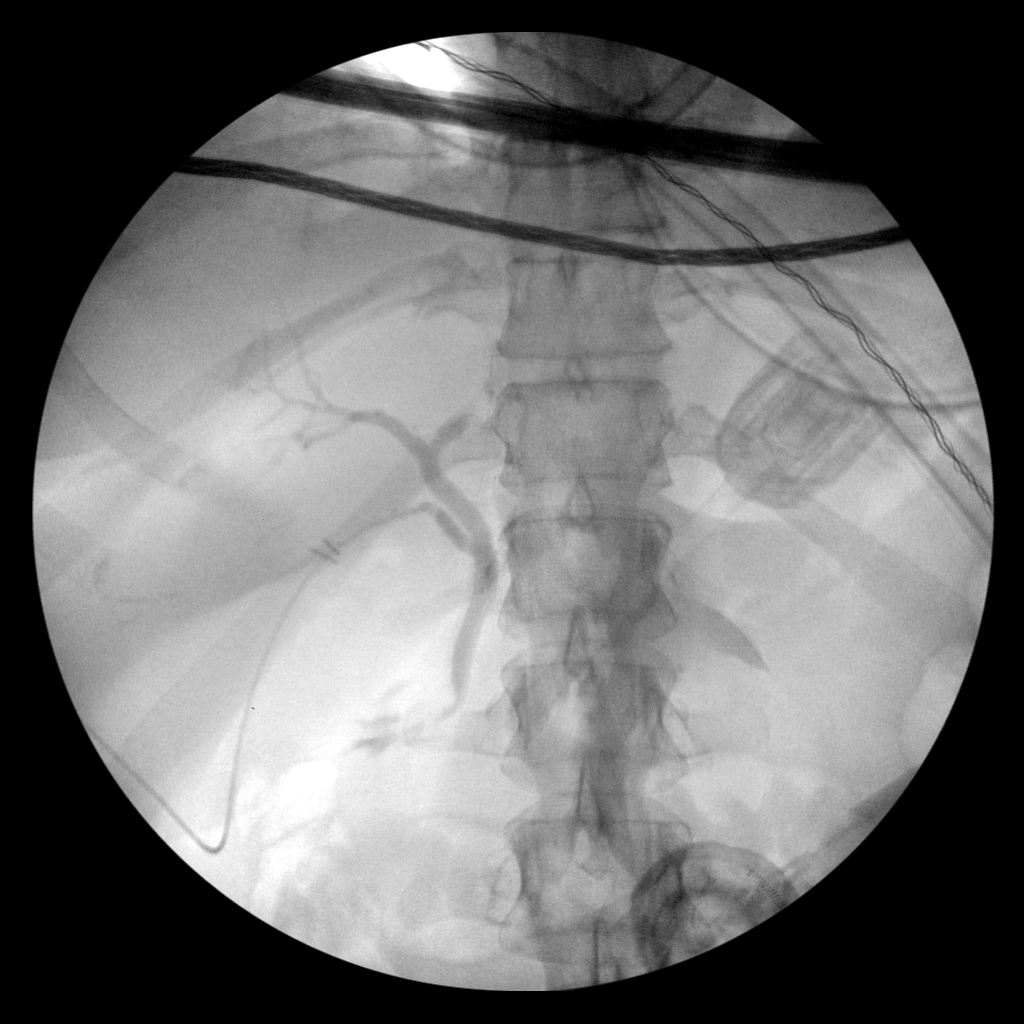
[frame 36/42]
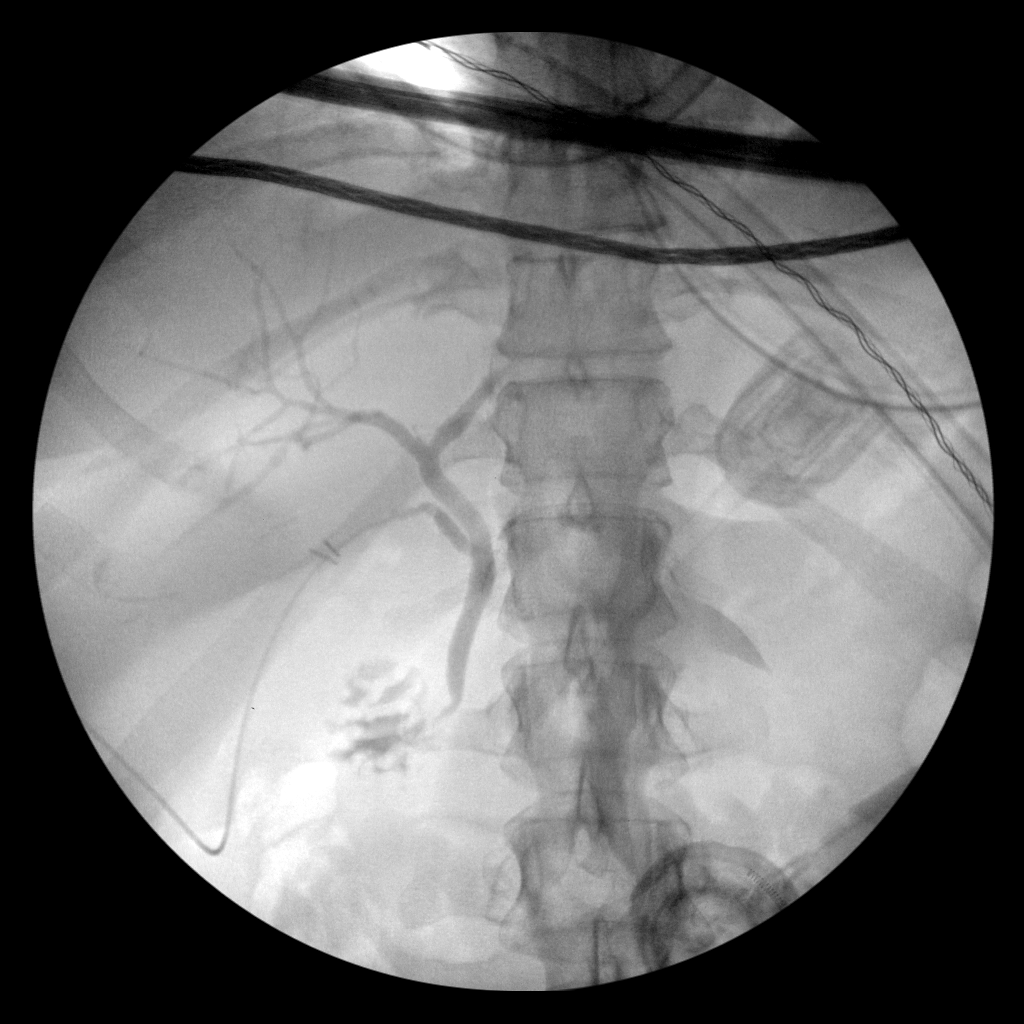

[4 of 4 positions shown; findings below may reference images not displayed]

FINDINGS: Surgical instruments project over the upper abdomen.

There is cannulation of the cystic duct/gallbladder neck, with
antegrade infusion of contrast. Caliber of the extrahepatic ductal
system within normal limits.

No definite filling defect within the extrahepatic ducts identified.

Free flow of contrast across the ampulla.
IMPRESSION: Intraoperative cholangiogram demonstrates extrahepatic biliary ducts
of unremarkable caliber, with no definite filling defects
identified. Free flow of contrast across the ampulla.

Please refer to the dictated operative report for full details of
intraoperative findings and procedure

## 2020-07-25 IMAGING — DX DG FINGER THUMB 2+V*L*
3 series · 3 of 3 positions shown · non-contrast
Comparison: Left hand radiographs-earlier same date

CLINICAL DATA: Post reduction

EXAM:
LEFT THUMB 2+V

[x finger pa left]
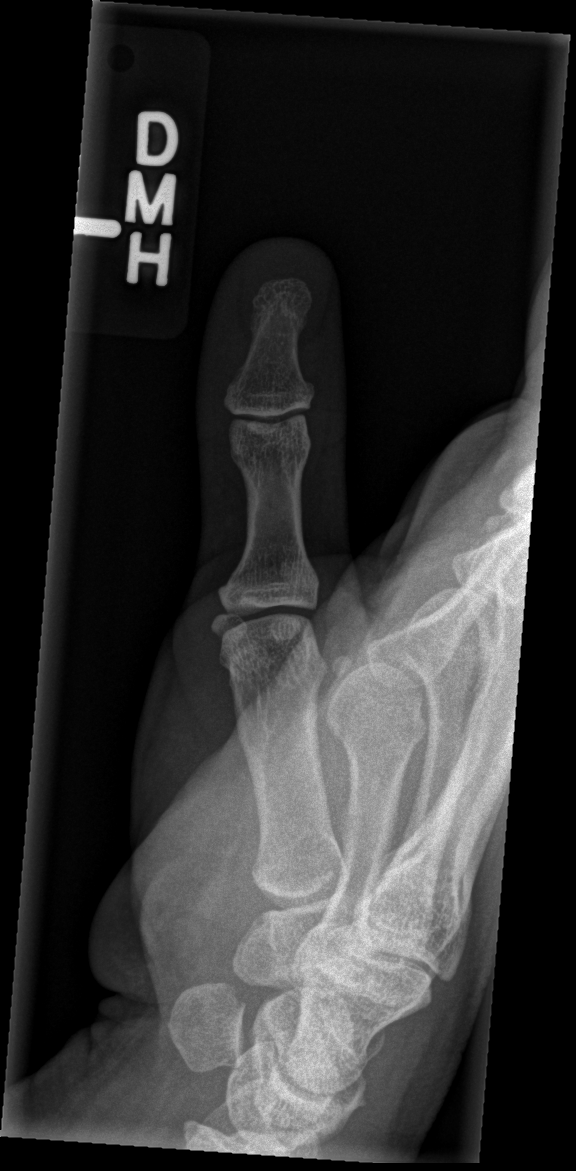

[x finger obl left]
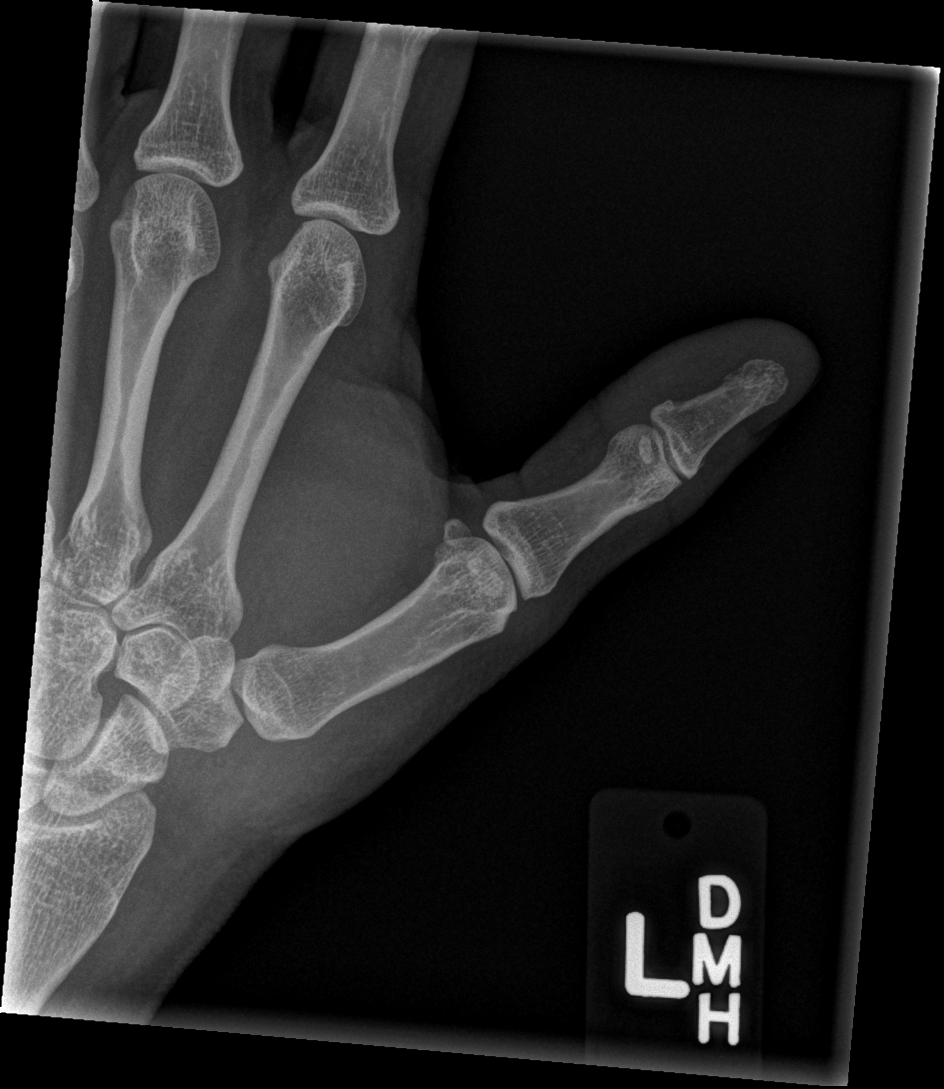

[x finger lat left]
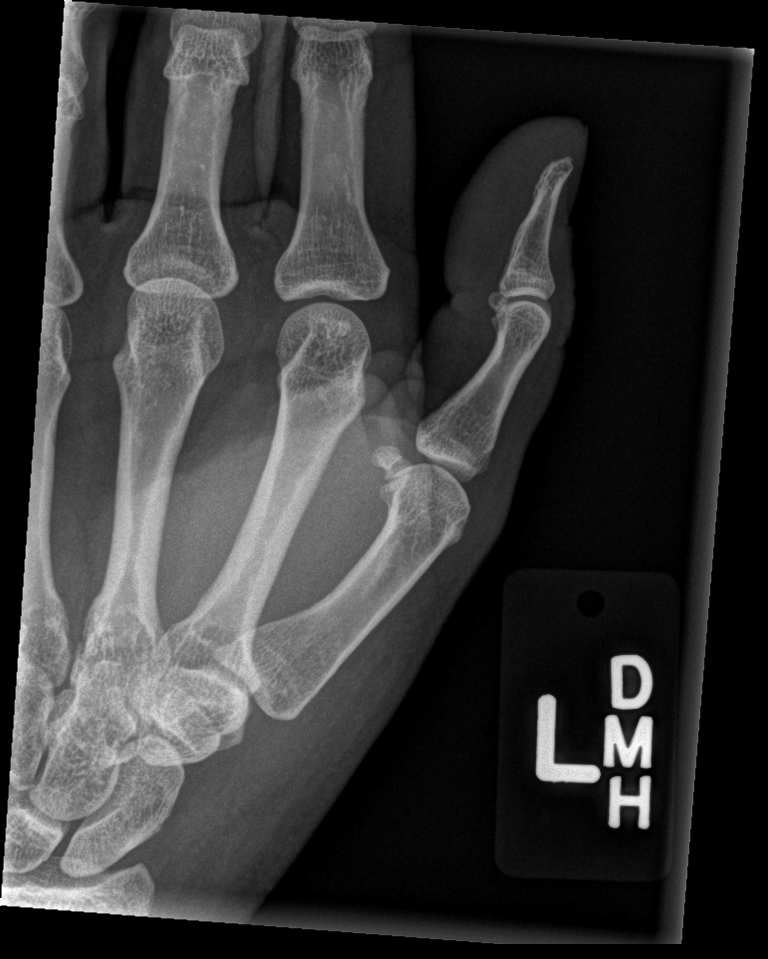

[3 of 3 positions shown; findings below may reference images not displayed]

FINDINGS: Interval reduction of previously noted dislocation/subluxation
involving the base of the first metacarpal. Alignment now appears
anatomic. No associated fracture. Joint spaces are preserved. No
erosions. Minimal amount of expected adjacent soft tissue swelling
persists. No radiopaque foreign body.
IMPRESSION: Uncomplicated reduction of dislocation/subluxation of the base of
the first metacarpal.
